# Patient Record
Sex: Female | Born: 1960 | Race: Black or African American | Hispanic: No | Marital: Single | Smoking: Never smoker
Health system: Southern US, Community
[De-identification: ages and names within clinical notes are randomized; demographics above are authoritative.]

## PROBLEM LIST (undated history)

## (undated) DIAGNOSIS — I10 Essential (primary) hypertension: Secondary | ICD-10-CM

## (undated) DIAGNOSIS — R079 Chest pain, unspecified: Secondary | ICD-10-CM

## (undated) DIAGNOSIS — G8929 Other chronic pain: Secondary | ICD-10-CM

## (undated) DIAGNOSIS — F419 Anxiety disorder, unspecified: Secondary | ICD-10-CM

## (undated) DIAGNOSIS — J45909 Unspecified asthma, uncomplicated: Secondary | ICD-10-CM

## (undated) DIAGNOSIS — M25569 Pain in unspecified knee: Secondary | ICD-10-CM

## (undated) DIAGNOSIS — M549 Dorsalgia, unspecified: Secondary | ICD-10-CM

## (undated) DIAGNOSIS — R0602 Shortness of breath: Secondary | ICD-10-CM

## (undated) DIAGNOSIS — E559 Vitamin D deficiency, unspecified: Secondary | ICD-10-CM

## (undated) DIAGNOSIS — K259 Gastric ulcer, unspecified as acute or chronic, without hemorrhage or perforation: Secondary | ICD-10-CM

## (undated) DIAGNOSIS — G479 Sleep disorder, unspecified: Secondary | ICD-10-CM

## (undated) DIAGNOSIS — F32A Depression, unspecified: Secondary | ICD-10-CM

## (undated) DIAGNOSIS — R12 Heartburn: Secondary | ICD-10-CM

## (undated) DIAGNOSIS — M7989 Other specified soft tissue disorders: Secondary | ICD-10-CM

## (undated) DIAGNOSIS — R519 Headache, unspecified: Secondary | ICD-10-CM

## (undated) HISTORY — DX: Shortness of breath: R06.02

## (undated) HISTORY — DX: Sleep disorder, unspecified: G47.9

## (undated) HISTORY — DX: Other chronic pain: G89.29

## (undated) HISTORY — DX: Dorsalgia, unspecified: M54.9

## (undated) HISTORY — DX: Pain in unspecified knee: M25.569

## (undated) HISTORY — DX: Anxiety disorder, unspecified: F41.9

## (undated) HISTORY — DX: Headache, unspecified: R51.9

## (undated) HISTORY — DX: Unspecified asthma, uncomplicated: J45.909

## (undated) HISTORY — DX: Chest pain, unspecified: R07.9

## (undated) HISTORY — DX: Depression, unspecified: F32.A

## (undated) HISTORY — DX: Other specified soft tissue disorders: M79.89

## (undated) HISTORY — DX: Vitamin D deficiency, unspecified: E55.9

## (undated) HISTORY — DX: Essential (primary) hypertension: I10

## (undated) HISTORY — DX: Heartburn: R12

## (undated) HISTORY — DX: Gastric ulcer, unspecified as acute or chronic, without hemorrhage or perforation: K25.9

---

## 1996-06-10 HISTORY — PX: OVARIAN CYST REMOVAL: SHX89

## 1998-07-28 ENCOUNTER — Encounter: Payer: Self-pay | Admitting: Obstetrics and Gynecology

## 1998-07-28 ENCOUNTER — Ambulatory Visit (HOSPITAL_COMMUNITY): Admission: RE | Admit: 1998-07-28 | Discharge: 1998-07-28 | Payer: Self-pay | Admitting: Obstetrics and Gynecology

## 1998-08-07 ENCOUNTER — Ambulatory Visit (HOSPITAL_COMMUNITY): Admission: RE | Admit: 1998-08-07 | Discharge: 1998-08-07 | Payer: Self-pay | Admitting: Obstetrics and Gynecology

## 1998-08-07 ENCOUNTER — Encounter: Payer: Self-pay | Admitting: Obstetrics and Gynecology

## 1999-01-25 ENCOUNTER — Other Ambulatory Visit: Admission: RE | Admit: 1999-01-25 | Discharge: 1999-01-25 | Payer: Self-pay | Admitting: Obstetrics and Gynecology

## 2000-06-20 ENCOUNTER — Other Ambulatory Visit: Admission: RE | Admit: 2000-06-20 | Discharge: 2000-06-20 | Payer: Self-pay | Admitting: Obstetrics and Gynecology

## 2001-08-05 ENCOUNTER — Other Ambulatory Visit: Admission: RE | Admit: 2001-08-05 | Discharge: 2001-08-05 | Payer: Self-pay | Admitting: Obstetrics and Gynecology

## 2001-10-12 ENCOUNTER — Other Ambulatory Visit: Admission: RE | Admit: 2001-10-12 | Discharge: 2001-10-12 | Payer: Self-pay | Admitting: Obstetrics and Gynecology

## 2002-11-22 ENCOUNTER — Other Ambulatory Visit: Admission: RE | Admit: 2002-11-22 | Discharge: 2002-11-22 | Payer: Self-pay | Admitting: Obstetrics and Gynecology

## 2003-12-27 ENCOUNTER — Other Ambulatory Visit: Admission: RE | Admit: 2003-12-27 | Discharge: 2003-12-27 | Payer: Self-pay | Admitting: Obstetrics and Gynecology

## 2005-03-04 ENCOUNTER — Other Ambulatory Visit: Admission: RE | Admit: 2005-03-04 | Discharge: 2005-03-04 | Payer: Self-pay | Admitting: Obstetrics and Gynecology

## 2005-03-15 ENCOUNTER — Encounter: Admission: RE | Admit: 2005-03-15 | Discharge: 2005-03-15 | Payer: Self-pay | Admitting: Obstetrics and Gynecology

## 2008-06-09 ENCOUNTER — Encounter: Admission: RE | Admit: 2008-06-09 | Discharge: 2008-06-09 | Payer: Self-pay | Admitting: Obstetrics and Gynecology

## 2009-07-28 ENCOUNTER — Encounter: Admission: RE | Admit: 2009-07-28 | Discharge: 2009-07-28 | Payer: Self-pay | Admitting: Obstetrics and Gynecology

## 2010-06-30 ENCOUNTER — Encounter: Payer: Self-pay | Admitting: Obstetrics and Gynecology

## 2013-12-27 ENCOUNTER — Ambulatory Visit: Payer: Self-pay | Admitting: Podiatry

## 2014-01-19 ENCOUNTER — Ambulatory Visit (INDEPENDENT_AMBULATORY_CARE_PROVIDER_SITE_OTHER): Payer: 59

## 2014-01-19 ENCOUNTER — Encounter: Payer: Self-pay | Admitting: Podiatry

## 2014-01-19 ENCOUNTER — Ambulatory Visit (INDEPENDENT_AMBULATORY_CARE_PROVIDER_SITE_OTHER): Payer: 59 | Admitting: Podiatry

## 2014-01-19 VITALS — BP 115/69 | HR 66 | Resp 16 | Ht 64.0 in | Wt 170.0 lb

## 2014-01-19 DIAGNOSIS — L84 Corns and callosities: Secondary | ICD-10-CM

## 2014-01-19 DIAGNOSIS — M898X9 Other specified disorders of bone, unspecified site: Secondary | ICD-10-CM

## 2014-01-19 DIAGNOSIS — M7750 Other enthesopathy of unspecified foot: Secondary | ICD-10-CM

## 2014-01-19 MED ORDER — TRIAMCINOLONE ACETONIDE 10 MG/ML IJ SUSP
10.0000 mg | Freq: Once | INTRAMUSCULAR | Status: AC
  Administered 2014-01-19: 10 mg

## 2014-01-19 NOTE — Progress Notes (Signed)
   Subjective:    Patient ID: Alison GroomsCarmen D Cook, female    DOB: 1961-03-12, 53 y.o.   MRN: [id number]  HPI Comments: "I had a larger knot on my foot but it has went down some"  Patient c/o tender dorsal foot right for about 2 months. There is a knot. She states that it was much larger few days ago. Painful with shoes and walking.   Also concerned about a callused area plantar 1st toe left.  Foot Pain Associated symptoms include diaphoresis, fatigue, headaches and numbness.      Review of Systems  Constitutional: Positive for diaphoresis, appetite change, fatigue and unexpected weight change.  HENT: Positive for sinus pressure.   Gastrointestinal: Positive for constipation and abdominal distention.  Musculoskeletal: Positive for back pain.  Allergic/Immunologic: Positive for food allergies.  Neurological: Positive for dizziness, light-headedness, numbness and headaches.  Hematological: Positive for adenopathy.  All other systems reviewed and are negative.      Objective:   Physical Exam        Assessment & Plan:

## 2014-01-20 NOTE — Progress Notes (Signed)
Subjective:     Patient ID: Alison Cook, female   DOB: 1961/05/18, 53 y.o.   MRN: [id number]  Foot Pain   patient has spot on top of her right foot that's really been bothering her and makes it hard to wear shoe gear comfortably and also has callus on her left big toe which can become sore. It's been going on for several months   Review of Systems  All other systems reviewed and are negative.      Objective:   Physical Exam  Nursing note and vitals reviewed. Constitutional: She is oriented to person, place, and time.  Cardiovascular: Intact distal pulses.   Musculoskeletal: Normal range of motion.  Neurological: She is oriented to person, place, and time.  Skin: Skin is warm.   neurovascular status intact with muscle strength adequate and range of motion of the subtalar midtarsal joint within normal limits. Patient is found to have a area of inflammation on the dorsum of the right foot that is painful when pressed from a dorsal direction with fluid buildup and is noted to have keratotic lesion left big toe. 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 Careful injection administered dorsal to reduce inflammation and if it does not get better we will need to consider tarsal exostectomy and debrided tissue on the left hallux

## 2015-06-26 ENCOUNTER — Encounter: Payer: Self-pay | Admitting: Podiatry

## 2015-06-26 ENCOUNTER — Ambulatory Visit (INDEPENDENT_AMBULATORY_CARE_PROVIDER_SITE_OTHER): Payer: 59 | Admitting: Podiatry

## 2015-06-26 ENCOUNTER — Ambulatory Visit (INDEPENDENT_AMBULATORY_CARE_PROVIDER_SITE_OTHER): Payer: 59

## 2015-06-26 DIAGNOSIS — M722 Plantar fascial fibromatosis: Secondary | ICD-10-CM

## 2015-06-26 MED ORDER — TRIAMCINOLONE ACETONIDE 10 MG/ML IJ SUSP
10.0000 mg | Freq: Once | INTRAMUSCULAR | Status: AC
  Administered 2015-06-26: 10 mg

## 2015-06-26 MED ORDER — DICLOFENAC SODIUM 75 MG PO TBEC: 75.0000 mg | DELAYED_RELEASE_TABLET | Freq: Two times a day (BID) | ORAL | Status: DC

## 2015-06-26 NOTE — Patient Instructions (Signed)

## 2015-06-28 NOTE — Progress Notes (Signed)
Subjective:     Patient ID: Alison Cook, female   DOB: 13-May-1961, 55 y.o.   MRN: [id number]  HPI patient states I have a lot of pain in the bottom of both my heels left over right and it's been present for around a year. I've tried supportive shoes I tried stretching exercises without relief   Review of Systems  All other systems reviewed and are negative.      Objective:   Physical Exam  Constitutional: She is oriented to person, place, and time.  Cardiovascular: Intact distal pulses.   Musculoskeletal: Normal range of motion.  Neurological: She is oriented to person, place, and time.  Skin: Skin is warm.  Nursing note and vitals reviewed.  neurovascular status found to be intact with muscle strength adequate range of motion within normal limits. Patient's noted to have exquisite discomfort in the plantar aspect of the heel left over right with inflammation fluid around the medial band and also noted that there is moderate depression of the arch bilateral. 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 Today I went ahead and I injected the plantar fascia bilateral 3 mg Kenalog 5 mg Xylocaine and applied fascial brace and instructed on reduced activity physical therapy and supportive shoe gear usage. Reappoint to recheck

## 2015-07-10 ENCOUNTER — Ambulatory Visit: Payer: 59 | Admitting: Podiatry

## 2015-07-21 ENCOUNTER — Encounter: Payer: Self-pay | Admitting: Podiatry

## 2015-07-21 ENCOUNTER — Ambulatory Visit (INDEPENDENT_AMBULATORY_CARE_PROVIDER_SITE_OTHER): Payer: 59 | Admitting: Podiatry

## 2015-07-21 VITALS — BP 120/68 | HR 68 | Resp 16

## 2015-07-21 DIAGNOSIS — M722 Plantar fascial fibromatosis: Secondary | ICD-10-CM

## 2015-07-24 NOTE — Progress Notes (Signed)
Subjective:     Patient ID: Alison Cook, female   DOB: 1960/12/17, 55 y.o.   MRN: [id number]  HPI patient states I'm improving but I'm still having pain and I've had a long-term history of the problem   Review of Systems     Objective:   Physical Exam Neurovascular status intact muscle strength adequate with diminished arch height noted and pain still noted upon palpation to the medial band of the plantar fascia    Assessment:     Continued fasciitis-like symptoms with depressed arch is part of the pathological process    Plan:     Reviewed physical therapy and anti-inflammatory supportive shoes and scanned for custom orthotics to reduce all plantar stresses on the feet. Patient will be seen back when they're ready or earlier if needed

## 2015-08-11 ENCOUNTER — Encounter: Payer: Self-pay | Admitting: Podiatry

## 2015-08-11 ENCOUNTER — Ambulatory Visit (INDEPENDENT_AMBULATORY_CARE_PROVIDER_SITE_OTHER): Payer: 59 | Admitting: Podiatry

## 2015-08-11 DIAGNOSIS — M722 Plantar fascial fibromatosis: Secondary | ICD-10-CM

## 2015-08-11 NOTE — Patient Instructions (Addendum)

## 2015-08-11 NOTE — Progress Notes (Signed)
Subjective:     Patient ID: Alison GroomsCarmen D Hockley, female   DOB: 1960-12-11, 55 y.o.   MRN: [id number]  HPI this patient presents the office to check pick up her orthotics. due to her plantar fasciitis.  She states that she is not having much pain and discomfort, but is pleased to receive her orthoses   Review of Systems     Objective:   Physical Exam neurovascular status intact     Assessment:     Plantar fascitis B/L       Plan:     Dispense orthoses.  RTC prn   Helane GuntherGregory Chue Berkovich DPM

## 2017-09-05 DIAGNOSIS — Z01419 Encounter for gynecological examination (general) (routine) without abnormal findings: Secondary | ICD-10-CM | POA: Diagnosis not present

## 2017-11-17 DIAGNOSIS — R05 Cough: Secondary | ICD-10-CM | POA: Diagnosis not present

## 2017-12-12 DIAGNOSIS — R05 Cough: Secondary | ICD-10-CM | POA: Diagnosis not present

## 2017-12-12 DIAGNOSIS — J309 Allergic rhinitis, unspecified: Secondary | ICD-10-CM | POA: Diagnosis not present

## 2018-04-30 ENCOUNTER — Ambulatory Visit (INDEPENDENT_AMBULATORY_CARE_PROVIDER_SITE_OTHER): Payer: 59 | Admitting: Otolaryngology

## 2018-04-30 DIAGNOSIS — R040 Epistaxis: Secondary | ICD-10-CM

## 2018-05-28 ENCOUNTER — Ambulatory Visit (INDEPENDENT_AMBULATORY_CARE_PROVIDER_SITE_OTHER): Payer: 59 | Admitting: Otolaryngology

## 2018-05-28 DIAGNOSIS — R040 Epistaxis: Secondary | ICD-10-CM

## 2018-06-29 DIAGNOSIS — H524 Presbyopia: Secondary | ICD-10-CM | POA: Diagnosis not present

## 2018-07-21 DIAGNOSIS — J209 Acute bronchitis, unspecified: Secondary | ICD-10-CM | POA: Diagnosis not present

## 2018-09-21 ENCOUNTER — Ambulatory Visit: Payer: 59 | Admitting: Allergy and Immunology

## 2019-02-06 ENCOUNTER — Emergency Department (HOSPITAL_BASED_OUTPATIENT_CLINIC_OR_DEPARTMENT_OTHER): Payer: 59

## 2019-02-06 ENCOUNTER — Other Ambulatory Visit: Payer: Self-pay

## 2019-02-06 ENCOUNTER — Emergency Department (HOSPITAL_COMMUNITY)
Admission: EM | Admit: 2019-02-06 | Discharge: 2019-02-06 | Disposition: A | Discharge status: Home / Self Care | Payer: 59 | Attending: Emergency Medicine | Admitting: Emergency Medicine

## 2019-02-06 ENCOUNTER — Encounter (HOSPITAL_COMMUNITY): Payer: Self-pay

## 2019-02-06 DIAGNOSIS — M79662 Pain in left lower leg: Secondary | ICD-10-CM | POA: Diagnosis not present

## 2019-02-06 DIAGNOSIS — Z79899 Other long term (current) drug therapy: Secondary | ICD-10-CM | POA: Insufficient documentation

## 2019-02-06 DIAGNOSIS — M7989 Other specified soft tissue disorders: Secondary | ICD-10-CM | POA: Diagnosis not present

## 2019-02-06 DIAGNOSIS — R202 Paresthesia of skin: Secondary | ICD-10-CM | POA: Diagnosis not present

## 2019-02-06 DIAGNOSIS — R600 Localized edema: Secondary | ICD-10-CM | POA: Insufficient documentation

## 2019-02-06 DIAGNOSIS — R200 Anesthesia of skin: Secondary | ICD-10-CM | POA: Insufficient documentation

## 2019-02-06 LAB — CBC WITH DIFFERENTIAL/PLATELET
Abs Immature Granulocytes: 0.02 10*3/uL (ref 0.00–0.07)
Basophils Absolute: 0.1 10*3/uL (ref 0.0–0.1)
Basophils Relative: 1 %
Eosinophils Absolute: 0.3 10*3/uL (ref 0.0–0.5)
Eosinophils Relative: 4 %
HCT: 37.7 % (ref 36.0–46.0)
Hemoglobin: 11.8 g/dL — ABNORMAL LOW (ref 12.0–15.0)
Immature Granulocytes: 0 %
Lymphocytes Relative: 33 %
Lymphs Abs: 2.6 10*3/uL (ref 0.7–4.0)
MCH: 27.8 pg (ref 26.0–34.0)
MCHC: 31.3 g/dL (ref 30.0–36.0)
MCV: 88.9 fL (ref 80.0–100.0)
Monocytes Absolute: 0.6 10*3/uL (ref 0.1–1.0)
Monocytes Relative: 7 %
Neutro Abs: 4.1 10*3/uL (ref 1.7–7.7)
Neutrophils Relative %: 55 %
Platelets: 236 10*3/uL (ref 150–400)
RBC: 4.24 MIL/uL (ref 3.87–5.11)
RDW: 14.9 % (ref 11.5–15.5)
WBC: 7.6 10*3/uL (ref 4.0–10.5)
nRBC: 0 % (ref 0.0–0.2)

## 2019-02-06 LAB — BASIC METABOLIC PANEL
Anion gap: 6 (ref 5–15)
BUN: 10 mg/dL (ref 6–20)
CO2: 28 mmol/L (ref 22–32)
Calcium: 9.1 mg/dL (ref 8.9–10.3)
Chloride: 106 mmol/L (ref 98–111)
Creatinine, Ser: 0.75 mg/dL (ref 0.44–1.00)
GFR calc Af Amer: >60 mL/min (ref >60)
GFR calc non Af Amer: >60 mL/min (ref >60)
Glucose, Bld: 98 mg/dL (ref 70–99)
Potassium: 4 mmol/L (ref 3.5–5.1)
Sodium: 140 mmol/L (ref 135–145)

## 2019-02-06 NOTE — ED Provider Notes (Signed)
[facility] Provider Note   CSN: [id number] Arrival date & time: 02/06/19  1130     History   Chief Complaint Chief Complaint  Patient presents with  . Foot Pain    HPI Alison Cook is a 58 y.o. female presents for evaluation of left lower extremity pain, swelling that is been ongoing for the last 3 weeks.  She states that initially, it started in her foot and now has progressed up into her ankle and her lower leg.  She denies any preceding trauma, injury.  She has been trying at home treatments to see if that would help with symptoms but states that it continued, prompting her PCP visit this morning.  [facility] who advised her to come to the emergency department for further evaluation.  She states that she has noticed some overlying warmth but has not noticed much color change.  She states that it has been more painful and to the side of her tib-fib but states that she has massaged it and help with that.  She states she occasionally has some numbness and tingling to the foot.  She denies any fevers, chest pain, difficulty breathing. She denies any OCP use, recent immobilization, prior history of DVT/PE, recent surgery, leg swelling, or long travel.     The history is provided by the patient.    History reviewed. No pertinent past medical history.  There are no active problems to display for this patient.   History reviewed. No pertinent surgical history.   OB History   No obstetric history on file.      Home Medications    Prior to Admission medications   Medication Sig Start Date End Date Taking? Authorizing Provider  BEE POLLEN PO Take by mouth.    [provider]  cyclobenzaprine (FLEXERIL) 10 MG tablet Take 10 mg by mouth 3 (three) times daily as needed for muscle spasms.    [provider]  Dexlansoprazole (DEXILANT PO) Take by mouth.    [provider]  diclofenac (VOLTAREN) 75  MG EC tablet Take 1 tablet (75 mg total) by mouth 2 (two) times daily. 06/26/15   Lenn Sinkegal, Norman S, DPM  Digestive Enzymes (ENZYME DIGEST PO) Take by mouth.    [provider]  doxycycline (VIBRAMYCIN) 100 MG capsule Take 100 mg by mouth 2 (two) times daily.    [provider]  Flaxseed, Linseed, (FLAX SEED OIL PO) Take by mouth.    [provider]  Fluticasone Propionate (FLONASE NA) Place into the nose.    [provider]  Linaclotide (LINZESS PO) Take by mouth.    [provider]  Lorcaserin HCl (BELVIQ PO) Take by mouth.    [provider]  Multiple Vitamins-Minerals (MULTIVITAMIN PO) Take by mouth.    [provider]  Probiotic Product (PROBIOTIC DAILY PO) Take by mouth.    [provider]    Family History History reviewed. No pertinent family history.  Social History Social History   Tobacco Use  . Smoking status: Never Smoker  Substance Use Topics  . Alcohol use: Not on file  . Drug use: Not on file     Allergies   Banana and Tomato   Review of Systems Review of Systems  Constitutional: Negative for fever.  Cardiovascular: Positive for leg swelling.  Neurological: Positive for numbness. Negative for weakness.  All other systems reviewed and are negative.    Physical Exam Updated Vital  Signs BP 136/81 (BP Location: Right Arm)   Pulse 65   Temp 98 F (36.7 C) (Oral)   Resp 16   SpO2 96%   Physical Exam Vitals signs and nursing note reviewed.  Constitutional:      Appearance: Normal appearance. She is well-developed.  HENT:     Head: Normocephalic and atraumatic.  Eyes:     General: Lids are normal.     Conjunctiva/sclera: Conjunctivae normal.     Pupils: Pupils are equal, round, and reactive to light.  Neck:     Musculoskeletal: Full passive range of motion without pain.  Cardiovascular:     Rate and Rhythm: Normal rate and regular rhythm.     Pulses:          Dorsalis pedis  pulses are 2+ on the right side and detected w/ Doppler on the left side.     Heart sounds: Normal heart sounds. No murmur. No friction rub. No gallop.      Comments: Difficulty obtaining palpable left DP pulse.  Easily obtainable with Doppler. Pulmonary:     Effort: Pulmonary effort is normal.     Breath sounds: Normal breath sounds.     Comments: Lungs clear to auscultation bilaterally.  Symmetric chest rise.  No wheezing, rales, rhonchi. Abdominal:     Palpations: Abdomen is soft. Abdomen is not rigid.     Tenderness: There is no abdominal tenderness. There is no guarding.     Comments: Abdomen is soft, non-distended, non-tender. No rigidity, No guarding. No peritoneal signs.  Musculoskeletal: Normal range of motion.     Comments: Left lower extremity with 1+ pitting edema that extends to the proximal tib-fib and is obviously larger than right lower extremity.  There is some tenderness noted to the medial aspect of the left tib-fib.  Some mild overlying warmth.  No obvious erythema.  Skin:    General: Skin is warm and dry.     Capillary Refill: Capillary refill takes less than 2 seconds.     Comments: Good distal cap refill. LLE is not dusky in appearance or cool to touch.  Neurological:     Mental Status: She is alert and oriented to person, place, and time.     Comments: Patient reports a tingling sensation to toes but states she can feel me touching her.  Psychiatric:        Speech: Speech normal.      ED Treatments / Results  Labs (all labs ordered are listed, but only abnormal results are displayed) Labs Reviewed  CBC WITH DIFFERENTIAL/PLATELET - Abnormal; Notable for the following components:      Result Value   Hemoglobin 11.8 (*)    All other components within normal limits  BASIC METABOLIC PANEL    EKG None  Radiology Vas [place] Lower Extremity Venous (dvt) ([facility] 7a-7p)  Result Date: 02/06/2019  Lower Venous Study Indications: Foot and ankle swelling.   Comparison Study: No prior study on file for comparison Performing Technologist: Sherren Kerns RVS  Examination Guidelines: A complete evaluation includes B-mode imaging, spectral Doppler, color Doppler, and power Doppler as needed of all accessible portions of each vessel. Bilateral testing is considered an integral part of a complete examination. Limited examinations for reoccurring indications may be performed as noted.  +-----+---------------+---------+-----------+----------+--------------+ RIGHTCompressibilityPhasicitySpontaneityPropertiesThrombus Aging +-----+---------------+---------+-----------+----------+--------------+ CFV                 Yes      Yes                                 +-----+---------------+---------+-----------+----------+--------------+   +---------+---------------+---------+-----------+----------+--------------+  LEFT     CompressibilityPhasicitySpontaneityPropertiesThrombus Aging +---------+---------------+---------+-----------+----------+--------------+ CFV      Full           Yes      Yes                                 +---------+---------------+---------+-----------+----------+--------------+ SFJ      Full                                                        +---------+---------------+---------+-----------+----------+--------------+ FV Prox  Full                                                        +---------+---------------+---------+-----------+----------+--------------+ FV Mid   Full                                                        +---------+---------------+---------+-----------+----------+--------------+ FV DistalFull                                                        +---------+---------------+---------+-----------+----------+--------------+ PFV      Full                                                        +---------+---------------+---------+-----------+----------+--------------+ POP      Full            Yes      Yes                                 +---------+---------------+---------+-----------+----------+--------------+ PTV      Full                                                        +---------+---------------+---------+-----------+----------+--------------+ PERO     Full                                                        +---------+---------------+---------+-----------+----------+--------------+     Summary: Right: No evidence of common femoral vein obstruction. Left: There is no evidence of deep vein thrombosis in the lower extremity.  *See table(s) above for measurements and observations. Electronically signed by Deitra Mayo MD on 02/06/2019 at 6:06:02 PM.  Final     Procedures Procedures (including critical care time)  Medications Ordered in ED Medications - No data to display   Initial Impression / Assessment and Plan / ED Course  I have reviewed the triage vital signs and the nursing notes.  Pertinent labs & imaging results that were available during my care of the patient were reviewed by me and considered in my medical decision making (see chart for details).        58 year old female who presents for evaluation of left lower extremity swelling x3 weeks.  No trauma, injury, fall.  Seen by PCP and sent over here for further evaluation.  No chest pain, shortness of breath. Patient is afebrile, non-toxic appearing, sitting comfortably on examination table. Vital signs reviewed and stable.  Concern for DVT versus infectious process vs vascular insufficiency.  Plan for labs, ultrasound venous duplex. History/physical exam not concerning for ischemic limb.  CBC shows no leukocytosis. Hgb stable at 11.8. BMP is unremarkable.   US shows no evidence of DVT.  Her symptoms could be related to vascular insufficiency.  I discussed results with patient.  At this time, I suspect that her symptoms are likely related to venous insufficiency.  She has no  white blood cell count, no fever.  Do not believe that this is infectious process.  Instructed patient on compression socks and elevation will give her vascular follow-up.  I did discuss at length with patient regarding further work-up here in the ED.  Engaged in shared decision making regarding further imaging to look for any other value of any compressive abnormality that would be in the abdomen that would be causing her symptoms.  Her edema stops at the tib-fib and does not extend all the way up to the leg.  After extensive discussion, patient wishes to try at home supportive care measures and follow-up with vascular.  Declines imaging at this time.  Given her reassuring work-up, I feel that this is reasonable. At this time, patient exhibits no emergent life-threatening condition that require further evaluation in ED or admission. Patient had ample opportunity for questions and discussion. All patient's questions were answered with full understanding. Strict return precautions discussed. Patient expresses understanding and agreement to plan.   Portions of this note were generated with Scientist, clinical (histocompatibility and immunogenetics)Dragon dictation software. Dictation errors may occur despite best attempts at proofreading.   Final Clinical Impressions(s) / ED Diagnoses   Final diagnoses:  Leg edema, left    ED Discharge Orders    None       Rosana HoesLayden, Lynita Groseclose A, PA-C 02/06/19 1850    Tegeler, Canary Brimhristopher J, MD 02/06/19 [phone]

## 2019-02-06 NOTE — ED Triage Notes (Signed)
Pt arrives for swelling in L foot, ongoing for approximately 3 weeks which she has been treating homeopathically. Approx two days ago worsening today toes began going numb, able to move foot and toes, pulse palpable but not as strong as R side. No known injury, has not travelled recently or been sedintary for long periods of time.

## 2019-02-06 NOTE — Progress Notes (Signed)
VASCULAR LAB PRELIMINARY  PRELIMINARY  PRELIMINARY  PRELIMINARY  Left lower extremity venous duplex completed.    Preliminary report:  See CV proc for preliminary results.  Gave results to Providence Lanius, PA-C  Gearold Wainer, RVT 02/06/2019, 4:14 PM

## 2019-02-06 NOTE — Discharge Instructions (Signed)
As we discussed today, your ultrasound did not show any evidence of blood clot in your leg.  As we discussed, this may be due to venous insufficiency.  Please wear compression socks and elevate your leg.  Follow-up with the vascular doctors.  Follow-up with your primary care doctors in the next 2 to 4 days.  Call their office and arrange for a follow-up appointment.  Return the emergency department for any worsening pain, discoloration of the leg, numbness/weakness, fevers or any other worsening or concerning symptoms.

## 2019-02-06 NOTE — ED Triage Notes (Signed)
Went to [facility] first, sent here to rule out DVT

## 2019-02-19 ENCOUNTER — Other Ambulatory Visit: Payer: Self-pay | Admitting: Family Medicine

## 2019-02-19 DIAGNOSIS — R600 Localized edema: Secondary | ICD-10-CM

## 2019-02-25 ENCOUNTER — Ambulatory Visit
Admission: RE | Admit: 2019-02-25 | Discharge: 2019-02-25 | Disposition: A | Discharge status: Home / Self Care | Payer: 59 | Source: Ambulatory Visit | Attending: Family Medicine | Admitting: Family Medicine

## 2019-02-25 DIAGNOSIS — R600 Localized edema: Secondary | ICD-10-CM

## 2019-02-25 MED ORDER — IOPAMIDOL (ISOVUE-370) INJECTION 76%
125.0000 mL | Freq: Once | INTRAVENOUS | Status: AC | PRN
  Administered 2019-02-25: 125 mL via INTRAVENOUS

## 2019-06-26 ENCOUNTER — Ambulatory Visit (HOSPITAL_COMMUNITY)
Admission: EM | Admit: 2019-06-26 | Discharge: 2019-06-26 | Disposition: A | Discharge status: Home / Self Care | Payer: 59 | Attending: Family Medicine | Admitting: Family Medicine

## 2019-06-26 ENCOUNTER — Other Ambulatory Visit: Payer: Self-pay

## 2019-06-26 ENCOUNTER — Encounter (HOSPITAL_COMMUNITY): Payer: Self-pay

## 2019-06-26 DIAGNOSIS — Z20822 Contact with and (suspected) exposure to covid-19: Secondary | ICD-10-CM | POA: Diagnosis present

## 2019-06-26 NOTE — ED Provider Notes (Signed)
[place]   MRN: [id number] DOB: [birth year]  Subjective:   Alison Cook is a 60 y.o. female presenting for Covid exposure.  Patient lives with her daughter, she tested positive for COVID-19.  Patient has not had any kinds of symptoms.  No current facility-administered medications for this encounter.  Current Outpatient Medications:  .  BEE POLLEN PO, Take by mouth., Disp: , Rfl:  .  cyclobenzaprine (FLEXERIL) 10 MG tablet, Take 10 mg by mouth 3 (three) times daily as needed for muscle spasms., Disp: , Rfl:  .  Dexlansoprazole (DEXILANT PO), Take by mouth., Disp: , Rfl:  .  diclofenac (VOLTAREN) 75 MG EC tablet, Take 1 tablet (75 mg total) by mouth 2 (two) times daily., Disp: 50 tablet, Rfl: 2 .  Digestive Enzymes (ENZYME DIGEST PO), Take by mouth., Disp: , Rfl:  .  doxycycline (VIBRAMYCIN) 100 MG capsule, Take 100 mg by mouth 2 (two) times daily., Disp: , Rfl:  .  Flaxseed, Linseed, (FLAX SEED OIL PO), Take by mouth., Disp: , Rfl:  .  Fluticasone Propionate (FLONASE NA), Place into the nose., Disp: , Rfl:  .  Linaclotide (LINZESS PO), Take by mouth., Disp: , Rfl:  .  Lorcaserin HCl (BELVIQ PO), Take by mouth., Disp: , Rfl:  .  Multiple Vitamins-Minerals (MULTIVITAMIN PO), Take by mouth., Disp: , Rfl:  .  Probiotic Product (PROBIOTIC DAILY PO), Take by mouth., Disp: , Rfl:    Allergies  Allergen Reactions  . Banana   . Tomato     History reviewed. No pertinent past medical history.   History reviewed. No pertinent surgical history.  History reviewed. No pertinent family history.  Social History   Tobacco Use  . Smoking status: Never Smoker  . Smokeless tobacco: Never Used  Substance Use Topics  . Alcohol use: Not on file  . Drug use: Not on file    Review of Systems  Constitutional: Negative for fever and malaise/fatigue.  HENT: Negative for congestion, ear pain, sinus pain and sore throat.   Eyes: Negative for discharge and redness.  Respiratory:  Negative for cough, hemoptysis, shortness of breath and wheezing.   Cardiovascular: Negative for chest pain.  Gastrointestinal: Negative for abdominal pain, diarrhea, nausea and vomiting.  Genitourinary: Negative for dysuria, flank pain and hematuria.  Musculoskeletal: Negative for myalgias.  Skin: Negative for rash.  Neurological: Negative for dizziness, weakness and headaches.  Psychiatric/Behavioral: Negative for depression and substance abuse.     Objective:   Vitals: BP 135/80 (BP Location: Right Arm)   Pulse 87   Temp 99.1 F (37.3 C) (Oral)   Resp 18   Wt 178 lb (80.7 kg)   SpO2 100%   BMI 30.55 kg/m   Physical Exam Constitutional:      General: She is not in acute distress.    Appearance: Normal appearance. She is well-developed. She is not ill-appearing, toxic-appearing or diaphoretic.  HENT:     Head: Normocephalic and atraumatic.     Nose: Nose normal.     Mouth/Throat:     Mouth: Mucous membranes are moist.     Pharynx: Oropharynx is clear.  Eyes:     General: No scleral icterus.    Extraocular Movements: Extraocular movements intact.     Pupils: Pupils are equal, round, and reactive to light.  Cardiovascular:     Rate and Rhythm: Normal rate.  Pulmonary:     Effort: Pulmonary effort is normal.  Skin:    General: Skin is warm  and dry.  Neurological:     General: No focal deficit present.     Mental Status: She is alert and oriented to person, place, and time.  Psychiatric:        Mood and Affect: Mood normal.        Behavior: Behavior normal.        Thought Content: Thought content normal.        Judgment: Judgment normal.      Assessment and Plan :   1. Close exposure to COVID-19 virus     Counseled patient on nature of COVID-19 including modes of transmission, diagnostic testing, management and supportive care.  Counseled on medications used for symptomatic relief. COVID 19 testing is pending. Counseled patient on potential for adverse  effects with medications prescribed/recommended today, ER and return-to-clinic precautions discussed, patient verbalized understanding.    Wallis Bamberg, PA-C 06/26/19 1100

## 2019-06-26 NOTE — ED Triage Notes (Signed)
Pt states she needs a covid test. Pt states her daughter tested positive 10 days ago.

## 2019-06-26 NOTE — Discharge Instructions (Signed)
For sore throat or cough try using a honey-based tea. Use 3 teaspoons of honey with juice squeezed from half lemon. Place shaved pieces of ginger into 1/2-1 cup of water and warm over stove top. Then mix the ingredients and repeat every 4 hours as needed. Please take Tylenol 500mg every 6 hours. Hydrate very well with at least 2 liters of water. Eat light meals such as soups to replenish electrolytes and soft fruits, veggies. Start an antihistamine like Zyrtec, Allegra or Claritin for postnasal drainage, sinus congestion.  You can take this together with pseudoephedrine (Sudafed) at a dose of 60 mg 3 times a day or 120 mg twice daily as needed for the same kind of congestion.    

## 2019-06-27 LAB — NOVEL CORONAVIRUS, NAA (HOSP ORDER, SEND-OUT TO REF LAB; TAT 18-24 HRS): SARS-CoV-2, NAA: NOT DETECTED

## 2019-09-27 ENCOUNTER — Other Ambulatory Visit: Payer: Self-pay

## 2019-09-27 ENCOUNTER — Ambulatory Visit (INDEPENDENT_AMBULATORY_CARE_PROVIDER_SITE_OTHER): Payer: 59 | Admitting: Podiatrist

## 2019-09-27 ENCOUNTER — Encounter: Payer: Self-pay | Admitting: Podiatrist

## 2019-09-27 VITALS — Temp 97.8°F

## 2019-09-27 DIAGNOSIS — L603 Nail dystrophy: Secondary | ICD-10-CM

## 2019-09-27 DIAGNOSIS — R609 Edema, unspecified: Secondary | ICD-10-CM | POA: Diagnosis not present

## 2019-09-27 DIAGNOSIS — M79676 Pain in unspecified toe(s): Secondary | ICD-10-CM

## 2019-09-27 NOTE — Progress Notes (Signed)
  Chief Complaint  Patient presents with  . Nail Problem    Thick, long, painful, discolored toenails - L foot, all nails. x1+ yr.      HPI: Patient is 59 y.o. female who presents today for the concerns as listed above.  She also has some new swelling of her legs of which she is concerned.  She had venous studies done and they were negative for dvt or svt.   Review of Systems  DATA OBTAINED: from patient  GENERAL: Feels well no fevers, no fatigue, no changes in appetite SKIN: No itching, no rashes, no open wounds EYES: No eye pain,no redness, no discharge EARS: No earache,no ringing of ears, NOSE: No congestion, no drainage, no bleeding  MOUTH/THROAT: No mouth pain, No sore throat, No difficulty chewing or swallowing  RESPIRATORY: No cough, no wheezing, no SOB CARDIAC: No chest pain,no heart palpitations, GI: No abdominal pain, No Nausea, no vomiting, no diarrhea, no heartburn or no reflux  GU: No dysuria, no increased frequency or urgency MUSCULOSKELETAL: No unrelieved bone/joint pain,  NEUROLOGIC: Awake, alert, appropriate to situation, No change in mental status. PSYCHIATRIC: No overt anxiety or sadness.No behavior issue.      Physical Exam  GENERAL APPEARANCE: Alert, conversant. Appropriately groomed. No acute distress.   VASCULAR: Pedal pulses palpable DP and PT bilateral.  Capillary refill time is immediate to all digits,  Proximal to distal cooling it warm to warm.  Digital hair growth is decreased bilateral. Swelling and edema present bilateral feet and legs- left greater than right  NEUROLOGIC: sensation is intact epicritically and protectively to 5.07 monofilament at 5/5 sites bilateral.  Light touch is intact bilateral, vibratory sensation intact bilateral, achilles tendon reflex is intact bilateral.   MUSCULOSKELETAL: acceptable muscle strength, tone and stability bilateral.  Intrinsic muscluature intact bilateral.  Range of motion at ankle and first MPJ is normal  bilateral.   DERMATOLOGIC: skin is warm, supple, and dry.  No open lesions noted.  No interdigital maceration noted bilateral.  Digital nails are thick, discolored with subungual debris present bilateral feet.   Assessment     ICD-10-CM   1. Edema, unspecified type  R60.9   2. Dystrophic nail  L60.3      Plan  Recommended a follow up with her primary care for further evaluation of edema as it does not appear to be caused from a foot or ankle problem and she already had her venous studies done which ruled out a venous cause. She will continue to wear her compression hose.  I also debrided her nails today without complication. She will return as needed for follow up or in 3 months for continued nail care.

## 2019-09-27 NOTE — Patient Instructions (Signed)
I have ordered a medication for you that will come from [facility] in [place]. They should be calling you to verify insurance and will mail the medication to you. If you live close by then you can go by their pharmacy to pick up the medication. Their phone number is [phone]. If you do not hear from them in the next few days, please give [place] a call at [phone].    Edema  Edema is when you have too much fluid in your body or under your skin. Edema may make your legs, feet, and ankles swell up. Swelling is also common in looser tissues, like around your eyes. This is a common condition. It gets more common as you get older. There are many possible causes of edema. Eating too much salt (sodium) and being on your feet or sitting for a long time can cause edema in your legs, feet, and ankles. Hot weather may make edema worse. Edema is usually painless. Your skin may look swollen or shiny. Follow these instructions at home:  Keep the swollen body part raised (elevated) above the level of your heart when you are sitting or lying down.  Do not sit still or stand for a long time.  Do not wear tight clothes. Do not wear garters on your upper legs.  Exercise your legs. This can help the swelling go down.  Wear elastic bandages or support stockings as told by your doctor.  Eat a low-salt (low-sodium) diet to reduce fluid as told by your doctor.  Depending on the cause of your swelling, you may need to limit how much fluid you drink (fluid restriction).  Take over-the-counter and prescription medicines only as told by your doctor. Contact a doctor if:  Treatment is not working.  You have heart, liver, or kidney disease and have symptoms of edema.  You have sudden and unexplained weight gain. Get help right away if:  You have shortness of breath or chest pain.  You cannot breathe when you lie down.  You have pain, redness, or warmth in the swollen areas.  You have heart,  liver, or kidney disease and get edema all of a sudden.  You have a fever and your symptoms get worse all of a sudden. Summary  Edema is when you have too much fluid in your body or under your skin.  Edema may make your legs, feet, and ankles swell up. Swelling is also common in looser tissues, like around your eyes.  Raise (elevate) the swollen body part above the level of your heart when you are sitting or lying down.  Follow your doctor's instructions about diet and how much fluid you can drink (fluid restriction). This information is not intended to replace advice given to you by your health care provider. Make sure you discuss any questions you have with your health care provider. Document Revised: 05/30/2017 Document Reviewed: 06/14/2016 Elsevier Patient Education  2020 [employer].

## 2020-02-23 ENCOUNTER — Other Ambulatory Visit: Payer: Self-pay | Admitting: Obstetrics and Gynecology

## 2020-02-23 DIAGNOSIS — N6310 Unspecified lump in the right breast, unspecified quadrant: Secondary | ICD-10-CM

## 2020-03-01 ENCOUNTER — Other Ambulatory Visit: Payer: 59

## 2020-03-01 ENCOUNTER — Other Ambulatory Visit: Payer: Self-pay

## 2020-03-01 DIAGNOSIS — M7989 Other specified soft tissue disorders: Secondary | ICD-10-CM

## 2020-03-02 ENCOUNTER — Encounter: Payer: 59 | Admitting: Vascular Surgery

## 2020-03-02 ENCOUNTER — Encounter (HOSPITAL_COMMUNITY): Payer: 59

## 2020-03-08 ENCOUNTER — Ambulatory Visit
Admission: RE | Admit: 2020-03-08 | Discharge: 2020-03-08 | Disposition: A | Discharge status: Home / Self Care | Payer: 59 | Source: Ambulatory Visit | Attending: Obstetrics and Gynecology | Admitting: Obstetrics and Gynecology

## 2020-03-08 ENCOUNTER — Other Ambulatory Visit: Payer: Self-pay

## 2020-03-08 DIAGNOSIS — N6310 Unspecified lump in the right breast, unspecified quadrant: Secondary | ICD-10-CM

## 2020-03-16 ENCOUNTER — Encounter: Payer: Self-pay | Admitting: Vascular Surgery

## 2020-03-16 ENCOUNTER — Ambulatory Visit (HOSPITAL_COMMUNITY)
Admission: RE | Admit: 2020-03-16 | Discharge: 2020-03-16 | Disposition: A | Discharge status: Home / Self Care | Payer: 59 | Source: Ambulatory Visit | Attending: Vascular Surgery | Admitting: Vascular Surgery

## 2020-03-16 ENCOUNTER — Other Ambulatory Visit: Payer: Self-pay

## 2020-03-16 ENCOUNTER — Ambulatory Visit (INDEPENDENT_AMBULATORY_CARE_PROVIDER_SITE_OTHER): Payer: 59 | Admitting: Vascular Surgery

## 2020-03-16 VITALS — BP 147/88 | HR 63 | Temp 98.5°F | Resp 18 | Ht 63.0 in | Wt 192.4 lb

## 2020-03-16 DIAGNOSIS — M7989 Other specified soft tissue disorders: Secondary | ICD-10-CM

## 2020-03-16 NOTE — Progress Notes (Signed)
REASON FOR CONSULT:    Left lower extremity edema.  The consult is requested by Dr. Tally Joe.  ASSESSMENT & PLAN:   LEFT LOWER EXTREMITY SWELLING: This patient has some mild chronic left lower extremity swelling.  She may have some mild lymphedema although her exam is fairly unimpressive.  I do not think she has significant venous disease.  She has no evidence of DVT on 3 separate studies.  On her study today she has no evidence of significant venous reflux or chronic venous insufficiency.  I have explained that whenever her legs are lower than her heart we do get swelling and we have discussed the importance of intermittent leg elevation and the proper positioning for this.  I explained that I do not think sitting in a chair and elevating her leg in a stool helps and that may actually make things worse.  I have encouraged her at work when she is sitting to get up and move around to activate her calf muscle pump.  We have discussed importance of exercise specifically walking and water aerobics.  I explained that it might be worth trying to get fitted for some knee-high compression stockings with only a mild gradient and maybe this would help her symptoms.  I am afraid that the stockings she got on the Internet may not fit correctly.  I reassured her that she has no evidence of arterial insufficiency.  I will be happy to see her back in any time if she develops any new vascular issues.  Waverly Ferrari, MD Office: [phone]   HPI:   Alison Cook is a pleasant 59 y.o. female, who is referred with left lower extremity swelling.  I have reviewed the records from the referring office.  The patient was seen with left lower extremity edema.  The patient's work-up had included a CT of the abdomen and pelvis with venous phase and this showed that the IVC and iliac veins were normal caliber and patency without evidence of stenosis, thrombus, or mass-effect.  I believe this was ordered by an  outpatient vein center.  On my history the patient developed gradual onset of left lower extremity swelling about a year ago.  Of note she had dropped something on her foot prior to that but has no previous history of DVT.  She had no previous vein procedures.  She has a job that requires her to sit all day and she is been doing this for 20+ years.  She has tried compression stockings that she got on the Internet but she states that these make her symptoms worse.  I am not sure that these fit correctly.  She does elevate her legs while she is sitting and this does not help.  She had a fairly extensive work-up.  On 02/06/2019 she had a duplex scan which showed no evidence of left lower extremity DVT.  On 02/25/2019 she had a CT scan which was unremarkable.  These results are described below.  She is otherwise fairly healthy.  She denies any previous abdominal surgery, inguinal surgery, or radiation therapy.  History reviewed. No pertinent past medical history.  History reviewed. No pertinent family history.  SOCIAL HISTORY: Social History   Socioeconomic History  . Marital status: Single    Spouse name: Not on file  . Number of children: Not on file  . Years of education: Not on file  . Highest education level: Not on file  Occupational History  . Not on file  Tobacco  Use  . Smoking status: Never Smoker  . Smokeless tobacco: Never Used  Substance and Sexual Activity  . Alcohol use: Not on file  . Drug use: Not on file  . Sexual activity: Not on file  Other Topics Concern  . Not on file  Social History Narrative  . Not on file   Social Determinants of Health   Financial Resource Strain:   . Difficulty of Paying Living Expenses: Not on file  Food Insecurity:   . Worried About Programme researcher, broadcasting/film/video in the Last Year: Not on file  . Ran Out of Food in the Last Year: Not on file  Transportation Needs:   . Lack of Transportation (Medical): Not on file  . Lack of Transportation  (Non-Medical): Not on file  Physical Activity:   . Days of Exercise per Week: Not on file  . Minutes of Exercise per Session: Not on file  Stress:   . Feeling of Stress : Not on file  Social Connections:   . Frequency of Communication with Friends and Family: Not on file  . Frequency of Social Gatherings with Friends and Family: Not on file  . Attends Religious Services: Not on file  . Active Member of Clubs or Organizations: Not on file  . Attends Banker Meetings: Not on file  . Marital Status: Not on file  Intimate Partner Violence:   . Fear of Current or Ex-Partner: Not on file  . Emotionally Abused: Not on file  . Physically Abused: Not on file  . Sexually Abused: Not on file    Allergies  Allergen Reactions  . Banana   . Tomato     Current Outpatient Medications  Medication Sig Dispense Refill  . BEE POLLEN PO Take by mouth.    . Cholecalciferol (VITAMIN D3) 10 MCG (400 UNIT) CAPS Take by mouth.    . levocetirizine (XYZAL) 5 MG tablet Take 5 mg by mouth every evening.    . loratadine (CLARITIN) 10 MG tablet Take 10 mg by mouth daily.    . Multiple Vitamins-Minerals (MULTIVITAMIN PO) Take by mouth.     No current facility-administered medications for this visit.    REVIEW OF SYSTEMS:  [X]  denotes positive finding, [ ]  denotes negative finding Cardiac  Comments:  Chest pain or chest pressure:    Shortness of breath upon exertion:    Short of breath when lying flat:    Irregular heart rhythm:        Vascular    Pain in calf, thigh, or hip brought on by ambulation:    Pain in feet at night that wakes you up from your sleep:  x   Blood clot in your veins:    Leg swelling:  x       Pulmonary    Oxygen at home:    Productive cough:     Wheezing:         Neurologic    Sudden weakness in arms or legs:     Sudden numbness in arms or legs:     Sudden onset of difficulty speaking or slurred speech:    Temporary loss of vision in one eye:       Problems with dizziness:         Gastrointestinal    Blood in stool:     Vomited blood:         Genitourinary    Burning when urinating:     Blood in urine:  Psychiatric    Major depression:         Hematologic    Bleeding problems:    Problems with blood clotting too easily:        Skin    Rashes or ulcers:        Constitutional    Fever or chills:     PHYSICAL EXAM:   Vitals:   03/16/20 1443  BP: (!) 147/88  Pulse: 63  Resp: 18  Temp: 98.5 F (36.9 C)  TempSrc: Temporal  SpO2: 98%  Weight: 192 lb 6.4 oz (87.3 kg)  Height: 5\' 3"  (1.6 m)   Body mass index is 34.08 kg/m.  GENERAL: The patient is a well-nourished female, in no acute distress. The vital signs are documented above. CARDIAC: There is a regular rate and rhythm.  VASCULAR: I do not detect carotid bruits. She has palpable dorsalis pedis pulses bilaterally. She has biphasic dorsalis pedis and posterior tibial signals bilaterally. She has some mild swelling but on my measurement the calves are both 42-1/2 cm in diameter. PULMONARY: There is good air exchange bilaterally without wheezing or rales. ABDOMEN: Soft and non-tender with normal pitched bowel sounds.  MUSCULOSKELETAL: There are no major deformities or cyanosis. NEUROLOGIC: No focal weakness or paresthesias are detected. SKIN: There are no ulcers or rashes noted. PSYCHIATRIC: The patient has a normal affect.  DATA:    VENOUS DUPLEX: I have independently interpreted her venous duplex of the left lower extremity.   There is no evidence of DVT and no evidence of superficial venous thrombosis. There is no deep venous reflux. There is no significant superficial venous reflux.

## 2020-06-06 DIAGNOSIS — N840 Polyp of corpus uteri: Secondary | ICD-10-CM | POA: Insufficient documentation

## 2020-07-15 ENCOUNTER — Encounter (HOSPITAL_COMMUNITY): Payer: Self-pay | Admitting: Emergency Medicine

## 2020-07-15 ENCOUNTER — Ambulatory Visit (INDEPENDENT_AMBULATORY_CARE_PROVIDER_SITE_OTHER): Payer: 59

## 2020-07-15 ENCOUNTER — Other Ambulatory Visit: Payer: Self-pay

## 2020-07-15 ENCOUNTER — Ambulatory Visit (HOSPITAL_COMMUNITY)
Admission: EM | Admit: 2020-07-15 | Discharge: 2020-07-15 | Disposition: A | Discharge status: Home / Self Care | Payer: 59 | Attending: Student | Admitting: Student

## 2020-07-15 DIAGNOSIS — R062 Wheezing: Secondary | ICD-10-CM | POA: Diagnosis not present

## 2020-07-15 DIAGNOSIS — J3089 Other allergic rhinitis: Secondary | ICD-10-CM | POA: Diagnosis not present

## 2020-07-15 DIAGNOSIS — R7303 Prediabetes: Secondary | ICD-10-CM | POA: Diagnosis present

## 2020-07-15 DIAGNOSIS — Z20822 Contact with and (suspected) exposure to covid-19: Secondary | ICD-10-CM | POA: Diagnosis present

## 2020-07-15 DIAGNOSIS — R0602 Shortness of breath: Secondary | ICD-10-CM | POA: Diagnosis not present

## 2020-07-15 DIAGNOSIS — R519 Headache, unspecified: Secondary | ICD-10-CM

## 2020-07-15 DIAGNOSIS — G43701 Chronic migraine without aura, not intractable, with status migrainosus: Secondary | ICD-10-CM | POA: Insufficient documentation

## 2020-07-15 DIAGNOSIS — J189 Pneumonia, unspecified organism: Secondary | ICD-10-CM | POA: Insufficient documentation

## 2020-07-15 LAB — CBG MONITORING, ED: Glucose-Capillary: 98 mg/dL (ref 70–99)

## 2020-07-15 MED ORDER — PREDNISONE 20 MG PO TABS: 40.0000 mg | ORAL_TABLET | Freq: Every day | ORAL | 0 refills | Status: AC

## 2020-07-15 MED ORDER — AZITHROMYCIN 250 MG PO TABS: 250.0000 mg | ORAL_TABLET | ORAL | 0 refills | Status: DC

## 2020-07-15 NOTE — ED Triage Notes (Signed)
Patient c/o headache and wheezing x 1 week.   Patient denies any fever at home.   Patient states her family member has tested positive for COVID.   Patient endorses a history of migraines.   Patient has taken Ibuprofen w/ "not any" relief of symptoms.

## 2020-07-15 NOTE — Discharge Instructions (Addendum)
-  Z-pack: two pills on day one, then one pill per day for days 2-5 -Prednisone, two pills daily for 5 days. You can take both pills together in the morning.  -Continue allergy medications. -Head straight to ER if worst headache of life, 10 out of 10 headache that persists, loss of consciousness, weakness or tingling in arms or legs.  We are currently awaiting result of your PCR covid-19 test. This typically comes back in 1-2 days. We'll call you if the result is positive. Otherwise, the result will be sent electronically to your MyChart. You can also call this clinic and ask for your result via telephone.   Please isolate at home while awaiting these results. If your test is positive for Covid-19, continue to isolate at home for 5 days if you have mild symptoms, or a total of 10 days from symptom onset if you have more severe symptoms. If you quarantine for a shorter period of time (i.e. 5 days), make sure to wear a mask until day 10 of symptoms. Treat your symptoms at home with OTC remedies like tylenol/ibuprofen, mucinex, nyquil, etc. Seek medical attention if you develop high fevers, chest pain, shortness of breath, ear pain, facial pain, etc. Make sure to get up and move around every 2-3 hours while convalescing to help prevent blood clots. Drink plenty of fluids, and rest as much as possible.

## 2020-07-15 NOTE — ED Provider Notes (Addendum)
[facility]    CSN: [id number] Arrival date & time: 07/15/20  1001      History   Chief Complaint Chief Complaint  Patient presents with  . Headache  . Wheezing    HPI Alison Cook is a 60 y.o. female Patient c/o headache and wheezing x 1 week, following positive Covid exposure.  History of migraines.  History of seasonal allergies controlled on Claritin and Xyzal.  Today she presents with wheezing and headaches.  She states that she has felt that she is wheezing at night for 1 week since her Covid exposure.  She denies history of cardiovascular or pulmonary disease, however she states that she intermittently wheezes when her allergies are acting up.  States that she is feeling short of breath.  Denies chest pain.  denies cough.,  Fevers chills, nausea vomiting diarrhea, chest pain  Patient also describes episodes of intermittent headaches behind her eye.  Describes them as sharp and very painful.  Patient with history of chronic migraines, currently well controlled on over-the-counter medications.  Denies jaw pain, denies scalp pain, denies neck stiffness.  She states she already has an appointment with primary care to discuss this in 3 days.  However given covid exposure she may not be able to keep this appointment. Patient has taken Ibuprofen w/ "not any" relief of symptoms.   Denies worst headache of life denies weakness or tingling in arms or legs denies thunderclap headache.  HPI  History reviewed. No pertinent past medical history.  There are no problems to display for this patient.   History reviewed. No pertinent surgical history.  OB History   No obstetric history on file.      Home Medications    Prior to Admission medications   Medication Sig Start Date End Date Taking? Authorizing Provider  azithromycin (ZITHROMAX Z-PAK) 250 MG tablet Take 1 tablet (250 mg total) by mouth as directed. Z-pack: two pills on day 1, then one pill per day for days 2-5  07/15/20  Yes Rhys Martini, PA-C  Cholecalciferol (VITAMIN D3) 10 MCG (400 UNIT) CAPS Take by mouth.   Yes [provider]  Multiple Vitamins-Minerals (MULTIVITAMIN PO) Take by mouth.   Yes [provider]  predniSONE (DELTASONE) 20 MG tablet Take 2 tablets (40 mg total) by mouth daily for 5 days. 07/15/20 07/20/20 Yes Rhys Martini, PA-C  BEE POLLEN PO Take by mouth.    [provider]  levocetirizine (XYZAL) 5 MG tablet Take 5 mg by mouth every evening.    [provider]  loratadine (CLARITIN) 10 MG tablet Take 10 mg by mouth daily.    [provider]    Family History History reviewed. No pertinent family history.  Social History Social History   Tobacco Use  . Smoking status: Never Smoker  . Smokeless tobacco: Never Used     Allergies   Mucinex fast-max, Banana, and Tomato   Review of Systems Review of Systems  Constitutional: Negative for appetite change, chills and fever.  HENT: Negative for congestion, ear pain, rhinorrhea, sinus pressure, sinus pain and sore throat.   Eyes: Negative for redness and visual disturbance.  Respiratory: Positive for shortness of breath and wheezing. Negative for cough and chest tightness.   Cardiovascular: Negative for chest pain and palpitations.  Gastrointestinal: Negative for abdominal pain, constipation, diarrhea, nausea and vomiting.  Genitourinary: Negative for dysuria, frequency and urgency.  Musculoskeletal: Negative for myalgias.  Neurological: Negative for dizziness, weakness and headaches.  Psychiatric/Behavioral: Negative for confusion.  All other systems reviewed and are negative.    Physical Exam Triage Vital Signs ED Triage Vitals  Enc Vitals Group     BP 07/15/20 1013 (!) 143/90     Pulse Rate 07/15/20 1013 82     Resp 07/15/20 1013 16     Temp 07/15/20 1013 98.2 F (36.8 C)     Temp Source 07/15/20 1013 Oral     SpO2 07/15/20 1013 97 %     Weight 07/15/20 1011 192 lb  7.4 oz (87.3 kg)     Height 07/15/20 1011 5\' 3"  (1.6 m)     Head Circumference --      Peak Flow --      Pain Score 07/15/20 1010 4     Pain Loc --      Pain Edu? --      Excl. in GC? --    No data found.  Updated Vital Signs BP (!) 143/90 (BP Location: Right Arm)   Pulse 82   Temp 98.2 F (36.8 C) (Oral)   Resp 16   Ht 5\' 3"  (1.6 m)   Wt 192 lb 7.4 oz (87.3 kg)   SpO2 97%   BMI 34.09 kg/m   Visual Acuity Right Eye Distance:   Left Eye Distance:   Bilateral Distance:    Right Eye Near:   Left Eye Near:    Bilateral Near:     Physical Exam Vitals reviewed.  Constitutional:      General: She is not in acute distress.    Appearance: Normal appearance. She is not ill-appearing.  HENT:     Head: Normocephalic and atraumatic.     Jaw: No tenderness or pain on movement.     Comments: No scalp tenderness     Right Ear: Hearing, tympanic membrane, ear canal and external ear normal. No swelling or tenderness. There is no impacted cerumen. No mastoid tenderness. Tympanic membrane is not perforated, erythematous, retracted or bulging.     Left Ear: Hearing, tympanic membrane, ear canal and external ear normal. No swelling or tenderness. There is no impacted cerumen. No mastoid tenderness. Tympanic membrane is not perforated, erythematous, retracted or bulging.     Nose:     Right Sinus: No maxillary sinus tenderness or frontal sinus tenderness.     Left Sinus: No maxillary sinus tenderness or frontal sinus tenderness.     Mouth/Throat:     Mouth: Mucous membranes are moist.     Pharynx: Uvula midline. No oropharyngeal exudate or posterior oropharyngeal erythema.     Tonsils: No tonsillar exudate.  Cardiovascular:     Rate and Rhythm: Normal rate and regular rhythm.     Heart sounds: Normal heart sounds.  Pulmonary:     Breath sounds: Normal air entry. Decreased breath sounds and wheezing present. No rhonchi or rales.     Comments: Scattered wheezes and decreased breath  sounds throughout Chest:     Chest wall: No tenderness.  Abdominal:     General: Abdomen is flat. Bowel sounds are normal.     Tenderness: There is no abdominal tenderness. There is no guarding or rebound.  Lymphadenopathy:     Cervical: No cervical adenopathy.  Neurological:     General: No focal deficit present.     Mental Status: She is alert and oriented to person, place, and time.  Psychiatric:        Attention and Perception: Attention and perception normal.  Mood and Affect: Mood and affect normal.        Behavior: Behavior normal. Behavior is cooperative.        Thought Content: Thought content normal.        Judgment: Judgment normal.      UC Treatments / Results  Labs (all labs ordered are listed, but only abnormal results are displayed) Labs Reviewed  SARS CORONAVIRUS 2 (TAT 6-24 HRS)    EKG   Radiology DG Chest 2 View  Result Date: 07/15/2020 CLINICAL DATA:  Wheezing and shortness breath.  COVID exposure. EXAM: CHEST - 2 VIEW COMPARISON:  None. FINDINGS: Heart size is normal. Patchy airspace opacities are present right middle lobe and lingula. No edema or effusion is present. Axial skeleton is within normal limits. IMPRESSION: Patchy airspace opacities in the right middle lobe and lingula, concerning for pneumonia. Electronically Signed   By: Marin Roberts M.D.   On: 07/15/2020 10:52    Procedures Procedures (including critical care time)  Medications Ordered in UC Medications - No data to display  Initial Impression / Assessment and Plan / UC Course  I have reviewed the triage vital signs and the nursing notes.  Pertinent labs & imaging results that were available during my care of the patient were reviewed by me and considered in my medical decision making (see chart for details).      afebrile nontachycardic nontachypneic, oxygenating well on room air. Scattered wheezes and decreased breath sounds throughout. No history pulmonary disease.    CXR - Patchy airspace opacities in the right middle lobe and lingula, concerning for pneumonia.  Suspect Covid pneumonia given positive Covid exposure.  Prednisone and z-pack as below. Patient with history of prediabetes- CBG today 98.  Return precautions- chest pain, shortness of breath, new/worsening fevers/chills, confusion, worsening of symptoms despite the above treatment plan, etc.   Spent over 40 minutes obtaining H&P, performing physical, interpreting films,  discussing results, treatment plan and plan for follow-up with patient. Patient agrees with plan.    This chart was dictated using voice recognition software, Dragon. Despite the best efforts of this provider to proofread and correct errors, errors may still occur which can change documentation meaning.     Final Clinical Impressions(s) / UC Diagnoses   Final diagnoses:  Suspected COVID-19 virus infection  Exposure to confirmed case of COVID-19  Chronic migraine without aura with status migrainosus, not intractable  Seasonal allergic rhinitis due to other allergic trigger  Pneumonia of right middle lobe due to infectious organism     Discharge Instructions     -Z-pack: two pills on day one, then one pill per day for days 2-5 -Prednisone, two pills daily for 5 days. You can take both pills together in the morning.  -Continue allergy medications. -Head straight to ER if worst headache of life, 10 out of 10 headache that persists, loss of consciousness, weakness or tingling in arms or legs.  We are currently awaiting result of your PCR covid-19 test. This typically comes back in 1-2 days. We'll call you if the result is positive. Otherwise, the result will be sent electronically to your MyChart. You can also call this clinic and ask for your result via telephone.   Please isolate at home while awaiting these results. If your test is positive for Covid-19, continue to isolate at home for 5 days if you have mild  symptoms, or a total of 10 days from symptom onset if you have more severe symptoms. If you quarantine  for a shorter period of time (i.e. 5 days), make sure to wear a mask until day 10 of symptoms. Treat your symptoms at home with OTC remedies like tylenol/ibuprofen, mucinex, nyquil, etc. Seek medical attention if you develop high fevers, chest pain, shortness of breath, ear pain, facial pain, etc. Make sure to get up and move around every 2-3 hours while convalescing to help prevent blood clots. Drink plenty of fluids, and rest as much as possible.     ED Prescriptions    Medication Sig Dispense Auth. Provider   predniSONE (DELTASONE) 20 MG tablet Take 2 tablets (40 mg total) by mouth daily for 5 days. 10 tablet Rhys Martini, PA-C   azithromycin (ZITHROMAX Z-PAK) 250 MG tablet Take 1 tablet (250 mg total) by mouth as directed. Z-pack: two pills on day 1, then one pill per day for days 2-5 6 tablet Rhys Martini, PA-C     PDMP not reviewed this encounter.   Rhys Martini, PA-C 07/15/20 1102    Rhys Martini, PA-C 07/15/20 1120

## 2020-07-16 LAB — SARS CORONAVIRUS 2 (TAT 6-24 HRS): SARS Coronavirus 2: NEGATIVE

## 2020-07-21 ENCOUNTER — Other Ambulatory Visit: Payer: Self-pay | Admitting: Obstetrics and Gynecology

## 2020-07-21 DIAGNOSIS — Z803 Family history of malignant neoplasm of breast: Secondary | ICD-10-CM

## 2020-08-13 ENCOUNTER — Ambulatory Visit
Admission: RE | Admit: 2020-08-13 | Discharge: 2020-08-13 | Disposition: A | Discharge status: Home / Self Care | Payer: PRIVATE HEALTH INSURANCE | Source: Ambulatory Visit | Attending: Obstetrics and Gynecology | Admitting: Obstetrics and Gynecology

## 2020-08-13 ENCOUNTER — Other Ambulatory Visit: Payer: Self-pay

## 2020-08-13 DIAGNOSIS — Z803 Family history of malignant neoplasm of breast: Secondary | ICD-10-CM

## 2020-08-13 MED ORDER — GADOBUTROL 1 MMOL/ML IV SOLN
8.0000 mL | Freq: Once | INTRAVENOUS | Status: AC | PRN
  Administered 2020-08-13: 8 mL via INTRAVENOUS

## 2020-08-14 DIAGNOSIS — Z9189 Other specified personal risk factors, not elsewhere classified: Secondary | ICD-10-CM | POA: Insufficient documentation

## 2020-08-25 ENCOUNTER — Ambulatory Visit (INDEPENDENT_AMBULATORY_CARE_PROVIDER_SITE_OTHER): Payer: 59

## 2020-08-25 ENCOUNTER — Encounter (HOSPITAL_COMMUNITY): Payer: Self-pay

## 2020-08-25 ENCOUNTER — Ambulatory Visit (HOSPITAL_COMMUNITY)
Admission: EM | Admit: 2020-08-25 | Discharge: 2020-08-25 | Disposition: A | Discharge status: Home / Self Care | Payer: 59

## 2020-08-25 ENCOUNTER — Other Ambulatory Visit: Payer: Self-pay

## 2020-08-25 DIAGNOSIS — R059 Cough, unspecified: Secondary | ICD-10-CM

## 2020-08-25 HISTORY — DX: Essential (primary) hypertension: I10

## 2020-08-25 MED ORDER — BENZONATATE 100 MG PO CAPS: 100.0000 mg | ORAL_CAPSULE | Freq: Three times a day (TID) | ORAL | 0 refills | Status: AC | PRN

## 2020-08-25 NOTE — ED Triage Notes (Signed)
Pt in with c/o productive cough, lower back pain, and watery eyes that has been going on for about 2 weeks now  Pt was recently dx with pneumonia on  Last visit and was prescribed z pak and antibiotics but states her sxs have returned

## 2020-08-25 NOTE — ED Provider Notes (Signed)
[facility]  ____________________________________________  Time seen: Approximately 1:13 PM  I have reviewed the triage vital signs and the nursing notes.   HISTORY  Chief Complaint Wheezing, Cough, watery eyes, and Back Pain   Historian Patient     HPI Alison Cook is a 60 y.o. female presents to the urgent care with persistent cough, watery eyes and low back pain.  Patient states that she has been sleeping propped up at night as she has had nasal congestion.  She reports that she was seen and evaluated in February was diagnosed with community-acquired pneumonia and was started on azithromycin and prednisone.  She reports that her symptoms seem to resolve but her cough has returned.  She denies fever or chills.  No nausea, vomiting or abdominal pain.  No other alleviating measures have been attempted.   Past Medical History:  Diagnosis Date  . Hypertension      Immunizations up to date:  Yes.     Past Medical History:  Diagnosis Date  . Hypertension     There are no problems to display for this patient.   History reviewed. No pertinent surgical history.  Prior to Admission medications   Medication Sig Start Date End Date Taking? Authorizing Provider  benzonatate (TESSALON PERLES) 100 MG capsule Take 1 capsule (100 mg total) by mouth 3 (three) times daily as needed for up to 7 days for cough. 08/25/20 09/01/20 Yes Pia Mau M, PA-C  azithromycin (ZITHROMAX Z-PAK) 250 MG tablet Take 1 tablet (250 mg total) by mouth as directed. Z-pack: two pills on day 1, then one pill per day for days 2-5 07/15/20   Rhys Martini, PA-C  BEE POLLEN PO Take by mouth.    [provider]  Cholecalciferol (VITAMIN D3) 10 MCG (400 UNIT) CAPS Take by mouth.    [provider]  levocetirizine (XYZAL) 5 MG tablet Take 5 mg by mouth every evening.    [provider]  loratadine (CLARITIN) 10 MG tablet Take 10 mg by mouth daily.    [provider]  Multiple Vitamins-Minerals (MULTIVITAMIN PO) Take by mouth.    [provider]  olmesartan (BENICAR) 40 MG tablet Take 40 mg by mouth daily. 07/20/20   [provider]    Allergies Mucinex fast-max, Banana, and Tomato  History reviewed. No pertinent family history.  Social History Social History   Tobacco Use  . Smoking status: Never Smoker  . Smokeless tobacco: Never Used  Substance Use Topics  . Drug use: Never     Review of Systems  Constitutional: No fever/chills Eyes:  No discharge ENT: No upper respiratory complaints. Respiratory: Patient has cough. No SOB/ use of accessory muscles to breath Gastrointestinal:   No nausea, no vomiting.  No diarrhea.  No constipation. Musculoskeletal: Negative for musculoskeletal pain. Skin: Negative for rash, abrasions, lacerations, ecchymosis.   ____________________________________________   PHYSICAL EXAM:  VITAL SIGNS: ED Triage Vitals  Enc Vitals Group     BP 08/25/20 1254 (!) 156/94     Pulse Rate 08/25/20 1254 79     Resp 08/25/20 1254 17     Temp 08/25/20 1254 99.1 F (37.3 C)     Temp src --      SpO2 08/25/20 1254 95 %     Weight --      Height --      Head Circumference --      Peak Flow --      Pain Score 08/25/20  1252 3     Pain Loc --      Pain Edu? --      Excl. in GC? --      Constitutional: Alert and oriented. Well appearing and in no acute distress. Eyes: Conjunctivae are normal. PERRL. EOMI. Head: Atraumatic. ENT:      Nose: No congestion/rhinnorhea.      Mouth/Throat: Mucous membranes are moist.  Neck: No stridor.  No cervical spine tenderness to palpation. Cardiovascular: Normal rate, regular rhythm. Normal S1 and S2.  Good peripheral circulation. Respiratory: Normal respiratory effort without tachypnea or retractions. Lungs CTAB. Good air entry to the bases with no decreased or absent breath sounds Gastrointestinal: Bowel sounds x 4 quadrants. Soft and  nontender to palpation. No guarding or rigidity. No distention. Musculoskeletal: Full range of motion to all extremities. No obvious deformities noted Neurologic:  Normal for age. No gross focal neurologic deficits are appreciated.  Skin:  Skin is warm, dry and intact. No rash noted. Psychiatric: Mood and affect are normal for age. Speech and behavior are normal.   ____________________________________________   LABS (all labs ordered are listed, but only abnormal results are displayed)  Labs Reviewed - No data to display ____________________________________________  EKG   ____________________________________________  RADIOLOGY Geraldo Pitter, personally viewed and evaluated these images (plain radiographs) as part of my medical decision making, as well as reviewing the written report by the radiologist.    No results found.  ____________________________________________    PROCEDURES  Procedure(s) performed:     Procedures     Medications - No data to display   ____________________________________________   INITIAL IMPRESSION / ASSESSMENT AND PLAN / ED COURSE  Pertinent labs & imaging results that were available during my care of the patient were reviewed by me and considered in my medical decision making (see chart for details).      Assessment and Plan:  Cough:  60 year old female presents to the urgent care with cough, increased tearing and throat pruritus for the past several days.  Patient was hypertensive at triage but vital signs otherwise reassuring.  She had no wheezing on exam or other adventitious lung sounds auscultated.  Chest x-ray shows no consolidations, opacities or infiltrates.  Recommended Tessalon Perles and Mucinex DM for cough.  Patient is currently taking an antihistamine at night and recommended continuing medication.  Return precautions were given to return with new or worsening symptoms.  All patient questions were  answered.     ____________________________________________  FINAL CLINICAL IMPRESSION(S) / ED DIAGNOSES  Final diagnoses:  Cough      NEW MEDICATIONS STARTED DURING THIS VISIT:  ED Discharge Orders         Ordered    benzonatate (TESSALON PERLES) 100 MG capsule  3 times daily PRN        08/25/20 1517              This chart was dictated using voice recognition software/Dragon. Despite best efforts to proofread, errors can occur which can change the meaning. Any change was purely unintentional.     Orvil Feil, PA-C 08/25/20 1519

## 2020-08-25 NOTE — Discharge Instructions (Addendum)
I will contact you by phone with xray results.

## 2020-09-07 ENCOUNTER — Encounter (INDEPENDENT_AMBULATORY_CARE_PROVIDER_SITE_OTHER): Payer: Self-pay | Admitting: Family Medicine

## 2020-09-07 ENCOUNTER — Ambulatory Visit (INDEPENDENT_AMBULATORY_CARE_PROVIDER_SITE_OTHER): Payer: 59 | Admitting: Family Medicine

## 2020-09-07 ENCOUNTER — Other Ambulatory Visit: Payer: Self-pay

## 2020-09-07 ENCOUNTER — Telehealth (INDEPENDENT_AMBULATORY_CARE_PROVIDER_SITE_OTHER): Payer: Self-pay

## 2020-09-07 ENCOUNTER — Encounter (INDEPENDENT_AMBULATORY_CARE_PROVIDER_SITE_OTHER): Payer: Self-pay

## 2020-09-07 VITALS — BP 110/70 | HR 75 | Temp 98.1°F | Ht 63.0 in | Wt 191.0 lb

## 2020-09-07 DIAGNOSIS — E559 Vitamin D deficiency, unspecified: Secondary | ICD-10-CM | POA: Diagnosis not present

## 2020-09-07 DIAGNOSIS — Z9189 Other specified personal risk factors, not elsewhere classified: Secondary | ICD-10-CM

## 2020-09-07 DIAGNOSIS — Z1331 Encounter for screening for depression: Secondary | ICD-10-CM

## 2020-09-07 DIAGNOSIS — Z6833 Body mass index (BMI) 33.0-33.9, adult: Secondary | ICD-10-CM | POA: Insufficient documentation

## 2020-09-07 DIAGNOSIS — R5383 Other fatigue: Secondary | ICD-10-CM | POA: Diagnosis not present

## 2020-09-07 DIAGNOSIS — F39 Unspecified mood [affective] disorder: Secondary | ICD-10-CM

## 2020-09-07 DIAGNOSIS — R0602 Shortness of breath: Secondary | ICD-10-CM

## 2020-09-07 DIAGNOSIS — Z0289 Encounter for other administrative examinations: Secondary | ICD-10-CM

## 2020-09-07 DIAGNOSIS — I10 Essential (primary) hypertension: Secondary | ICD-10-CM | POA: Diagnosis not present

## 2020-09-07 DIAGNOSIS — E669 Obesity, unspecified: Secondary | ICD-10-CM

## 2020-09-07 NOTE — Patient Instructions (Signed)
Suicidal Feelings: How to Help Yourself Suicide is when you end your own life. There are many things you can do to help yourself feel better when struggling with these feelings. Many services and people are available to support you and others who struggle with similar feelings.  If you ever feel like you may hurt yourself or others, or have thoughts about taking your own life, get help right away. To get help:  Call your local emergency services (911 in the [place]  [employer]'s health and human services helpline (211 in the [place]  Go to your nearest emergency department.  Call a suicide hotline to speak with a trained counselor. The following suicide hotlines are available in the [place]: ? 1-800-273-TALK (1-800-273-8255). ? 1-800-SUICIDE (1-800-784-2433). ? 1-888-628-9454. This is a hotline for Spanish speakers. ? 1-800-799-4889. This is a hotline for TTY users. ? 1-866-4-U-TREVOR (1-866-488-7386). This is a hotline for lesbian, gay, bisexual, transgender, or questioning youth. ? For a list of hotlines in [place], visit www.suicide.org/hotlines/international/[place]-suicide-hotlines.html  Contact a crisis center or a local suicide prevention center. To find a crisis center or suicide prevention center: ? Call your local hospital, clinic, community service organization, mental health center, social service provider, or health department. Ask for help with connecting to a crisis center. ? For a list of crisis centers in the [place], visit: suicidepreventionlifeline.org ? For a list of crisis centers in [place], visit: suicideprevention.ca How to help yourself feel better  Promise yourself that you will not do anything extreme when you have suicidal feelings. Remember the times you have felt hopeful. Many people have gotten through suicidal thoughts and feelings, and you can too. If you have had these feelings before, remind yourself that you can get through them again.  Let  family, friends, teachers, or counselors know how you are feeling. Try not to separate yourself from those who care about you and want to help you. Talk with someone every day, even if you do not feel sociable. Face-to-face conversation is best to help them understand your feelings.  Contact a mental health care provider and work with this person regularly.  Make a safety plan that you can follow during a crisis. Include phone numbers of suicide prevention hotlines, mental health professionals, and trusted friends and family members you can call during an emergency. Save these numbers on your phone.  If you are thinking of taking a lot of medicine, give your medicine to someone who can give it to you as prescribed. If you are on antidepressants and are concerned you will overdose, tell your health care provider so that he or she can give you safer medicines.  Try to stick to your routines and follow a schedule every day. Make self-care a priority.  Make a list of realistic goals, and cross them off when you achieve them. Accomplishments can give you a sense of worth.  Wait until you are feeling better before doing things that you find difficult or unpleasant.  Do things that you have always enjoyed to take your mind off your feelings. Try reading a book, or listening to or playing music. Spending time outside, in nature, may help you feel better.   Follow these instructions at home:  Visit your primary health care provider every year for a checkup.  Work with a mental health care provider as needed.  Eat a well-balanced diet, and eat regular meals.  Get plenty of rest.  Exercise if you are able. Just 30 minutes of   exercise each day can help you feel better.  Take over-the-counter and prescription medicines only as told by your health care provider. Ask your mental health care provider about the possible side effects of any medicines you are taking.  Do not use alcohol or drugs, and  remove these substances from your home.  Remove weapons, poisons, knives, and other deadly items from your home.   General recommendations  Keep your living space well lit.  When you are feeling well, write yourself a letter with tips and support that you can read when you are not feeling well.  Remember that life's difficulties can be sorted out with help. Conditions can be treated, and you can learn behaviors and ways of thinking that will help you. Where to find more information  National Suicide Prevention Lifeline: www.suicidepreventionlifeline.org  Hopeline: www.hopeline.com  American Foundation for Suicide Prevention: www.afsp.org  The Trevor Project (for lesbian, gay, bisexual, transgender, or questioning youth): www.thetrevorproject.org  National Institute of Mental Health: https://www.nimh.nih.gov/health/topics/suicide-prevention Contact a health care provider if:  You feel as though you are a burden to others.  You feel agitated, angry, vengeful, or have extreme mood swings.  You have withdrawn from family and friends. Get help right away if:  You are talking about suicide or wishing to die.  You start making plans for how to commit suicide.  You feel that you have no reason to live.  You start making plans for putting your affairs in order, saying goodbye, or giving your possessions away.  You feel guilt, shame, or unbearable pain, and it seems like there is no way out.  You are frequently using drugs or alcohol.  You are engaging in risky behaviors that could lead to death. If you have any of these symptoms, get help right away. Call emergency services, go to your nearest emergency department or crisis center, or call a suicide crisis helpline. Summary  Suicide is when you take your own life.  Promise yourself that you will not do anything extreme when you have suicidal feelings.  Let family, friends, teachers, or counselors know how you are  feeling.  Get help right away if you start making plans for how to commit suicide. This information is not intended to replace advice given to you by your health care provider. Make sure you discuss any questions you have with your health care provider. Document Revised: 02/11/2020 Document Reviewed: 02/11/2020 Elsevier Patient Education  2021 [employer].  

## 2020-09-07 NOTE — Telephone Encounter (Signed)
Call and spoke to patient and gave her the phone numbers that she needs to obtain the help she needs.  Pt has agreed to call or to walk into the crisis clinic if needed.  My chart message sent with the information below.   [facility] (walk in [place]) [address] [place], [place] [address] [phone]   [facility] (Establish with a psychiatrist here) [address] [place], [place] M-Th 8am-5pm Friday, 8-1pm   [facility] [phone]) They will send a counselor and a therapist to your home, but you have to call them. They will make sure you get to where you need to go for care. [phone]

## 2020-09-08 LAB — COMPREHENSIVE METABOLIC PANEL
ALT: 20 IU/L (ref 0–32)
AST: 23 IU/L (ref 0–40)
Albumin/Globulin Ratio: 1.5 (ref 1.2–2.2)
Albumin: 4.5 g/dL (ref 3.8–4.9)
Alkaline Phosphatase: 109 IU/L (ref 44–121)
BUN/Creatinine Ratio: 19 (ref 9–23)
BUN: 13 mg/dL (ref 6–24)
Bilirubin Total: 0.6 mg/dL (ref 0.0–1.2)
CO2: 23 mmol/L (ref 20–29)
Calcium: 9.2 mg/dL (ref 8.7–10.2)
Chloride: 101 mmol/L (ref 96–106)
Creatinine, Ser: 0.69 mg/dL (ref 0.57–1.00)
Globulin, Total: 3.1 g/dL (ref 1.5–4.5)
Glucose: 90 mg/dL (ref 65–99)
Potassium: 4.5 mmol/L (ref 3.5–5.2)
Sodium: 142 mmol/L (ref 134–144)
Total Protein: 7.6 g/dL (ref 6.0–8.5)
eGFR: 100 mL/min/{1.73_m2} (ref >59)

## 2020-09-08 LAB — LIPID PANEL
Chol/HDL Ratio: 2.8 ratio (ref 0.0–4.4)
Cholesterol, Total: 200 mg/dL — ABNORMAL HIGH (ref 100–199)
HDL: 71 mg/dL (ref >39)
LDL Chol Calc (NIH): 117 mg/dL — ABNORMAL HIGH (ref 0–99)
Triglycerides: 68 mg/dL (ref 0–149)
VLDL Cholesterol Cal: 12 mg/dL (ref 5–40)

## 2020-09-08 LAB — HEMOGLOBIN A1C
Est. average glucose Bld gHb Est-mCnc: 114 mg/dL
Hgb A1c MFr Bld: 5.6 % (ref 4.8–5.6)

## 2020-09-08 LAB — CBC WITH DIFFERENTIAL/PLATELET
Basophils Absolute: 0.1 10*3/uL (ref 0.0–0.2)
Basos: 1 %
EOS (ABSOLUTE): 0.7 10*3/uL — ABNORMAL HIGH (ref 0.0–0.4)
Eos: 8 %
Hematocrit: 40.1 % (ref 34.0–46.6)
Hemoglobin: 12.6 g/dL (ref 11.1–15.9)
Immature Grans (Abs): 0 10*3/uL (ref 0.0–0.1)
Immature Granulocytes: 0 %
Lymphocytes Absolute: 2.5 10*3/uL (ref 0.7–3.1)
Lymphs: 27 %
MCH: 27.8 pg (ref 26.6–33.0)
MCHC: 31.4 g/dL — ABNORMAL LOW (ref 31.5–35.7)
MCV: 89 fL (ref 79–97)
Monocytes Absolute: 0.6 10*3/uL (ref 0.1–0.9)
Monocytes: 7 %
Neutrophils Absolute: 5.3 10*3/uL (ref 1.4–7.0)
Neutrophils: 57 %
Platelets: 230 10*3/uL (ref 150–450)
RBC: 4.53 x10E6/uL (ref 3.77–5.28)
RDW: 14.2 % (ref 11.7–15.4)
WBC: 9.2 10*3/uL (ref 3.4–10.8)

## 2020-09-08 LAB — VITAMIN B12: Vitamin B-12: 645 pg/mL (ref 232–1245)

## 2020-09-08 LAB — T3: T3, Total: 153 ng/dL (ref 71–180)

## 2020-09-08 LAB — INSULIN, RANDOM: INSULIN: 8.2 u[IU]/mL (ref 2.6–24.9)

## 2020-09-08 LAB — FOLATE: Folate: 11.9 ng/mL (ref >3.0)

## 2020-09-08 LAB — TSH: TSH: 0.801 u[IU]/mL (ref 0.450–4.500)

## 2020-09-08 LAB — T4, FREE: Free T4: 1.24 ng/dL (ref 0.82–1.77)

## 2020-09-08 LAB — VITAMIN D 25 HYDROXY (VIT D DEFICIENCY, FRACTURES): Vit D, 25-Hydroxy: 26.5 ng/mL — ABNORMAL LOW (ref 30.0–100.0)

## 2020-09-17 NOTE — Progress Notes (Signed)
Chief Complaint:   Alison Cook (MR# [id number]) is a 60 y.o. female who presents for evaluation and treatment of Alison and related comorbidities. Current BMI is Body mass index is 33.83 kg/m. Alison Cook has been struggling with Alison Cook weight for many years and has been unsuccessful in either losing weight, maintaining weight loss, or reaching Alison Cook healthy weight goal.  Alison Cook is currently in the action stage of change and ready to dedicate time achieving and maintaining a healthier weight. Alison Cook is interested in becoming our patient and working on intensive lifestyle modifications including (but not limited to) diet and exercise for weight loss.  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  Alison Cook lives with Alison Cook 1 year old daughter, Alison Cook.  Craves sweet/salty foods.  Snacks on tortilla chips and salsa, fruit, popcorn.  Alison Cook is tearful during Alison Cook visit today after we discussed Alison Cook PHQ results.  Alison Cook says Alison Cook has never told anyone Alison Cook feels this low.  Alison Cook mother died this past summer, which sparked these feelings.  Alison Cook habits were reviewed today and are as follows: Alison Cook family eats meals together, Alison Cook desired weight loss is 41 pounds, Alison Cook started gaining weight at 60 years of age, Alison Cook heaviest weight ever was 195 pounds, Alison Cook craves sweet and salty foods, Alison Cook frequently makes poor food choices, Alison Cook frequently eats larger portions than normal and Alison Cook struggles with emotional eating.  Depression Screen Alison Cook's Food and Mood (modified PHQ-9) score was 19.  Depression screen PHQ 2/9 09/07/2020  Decreased Interest 2  Down, Depressed, Hopeless 2  PHQ - 2 Score 4  Altered sleeping 3  Tired, decreased energy 3  Change in appetite 2  Feeling bad or failure about yourself  2  Trouble concentrating 2  Moving slowly or fidgety/restless 2  Suicidal thoughts 1  PHQ-9 Score 19  Difficult doing work/chores Very difficult     Assessment/Plan:   Orders Placed This Encounter  Procedures  . Vitamin B12  .  CBC with Differential/Platelet  . Comprehensive metabolic panel  . Folate  . Hemoglobin A1c  . Insulin, random  . Lipid panel  . T3  . T4, free  . TSH  . VITAMIN D 25 Hydroxy (Vit-D Deficiency, Fractures)  . Ambulatory referral to [facility]  . Ambulatory referral to Psychiatry  . EKG 12-Lead    Medications Discontinued During This Encounter  Medication Reason  . azithromycin (ZITHROMAX Z-PAK) 250 MG tablet   . Multiple Vitamins-Minerals (MULTIVITAMIN PO)       1. Other fatigue Alison Cook admits to daytime somnolence and reports waking up still tired. Patent has a history of symptoms of daytime fatigue, morning fatigue, morning headache and snoring. Alison Cook generally gets 4 or 5 hours of sleep per night, and states that Alison Cook has poor quality sleep. Snoring is present. Apneic episodes are present. Epworth Sleepiness Score is 15.  Shany does feel that Alison Cook weight is causing Alison Cook energy to be lower than it should be. Fatigue may be related to Alison, depression or many other causes. Labs will be ordered, and in the meanwhile, Alison Cook will focus on self care including making healthy food choices, increasing physical activity and focusing on stress reduction.  Alison Cook needs to see Alison Cook PCP ASAP regarding OSA evaluation and possible Sleep Medicine referral.  We discussed how important sleep is for weight loss.  Will check EKG and labs today.  - EKG 12-Lead - Vitamin B12 - CBC with Differential/Platelet - Comprehensive metabolic panel - Folate - Hemoglobin A1c - Insulin, random -  Lipid panel - T3 - T4, free - TSH - VITAMIN D 25 Hydroxy (Vit-D Deficiency, Fractures)  2. SOBOE (shortness of breath on exertion) Alison Cook notes increasing shortness of breath with exercising and seems to be worsening over time with weight gain. Alison Cook notes getting out of breath sooner with activity than Alison Cook used to. This has gotten worse recently. Alison Cook denies shortness of breath at rest or orthopnea.  Alison Cook  does feel that Alison Cook gets out of breath more easily that Alison Cook used to when Alison Cook exercises. Alison Cook shortness of breath appears to be Alison related and exercise induced. Alison Cook has agreed to work on weight loss and gradually increase exercise to treat Alison Cook exercise induced shortness of breath. Will continue to monitor closely.  Will check IC and labs today.  - Vitamin B12 - CBC with Differential/Platelet - Comprehensive metabolic panel - Folate - Hemoglobin A1c - Insulin, random - Lipid panel - T3 - T4, free - TSH - VITAMIN D 25 Hydroxy (Vit-D Deficiency, Fractures)  3. Essential hypertension At goal. Medications: olmesartan 40 mg daily.   Plan: Avoid buying foods that are: processed, frozen, or prepackaged to avoid excess salt. We will continue to monitor closely alongside Alison Cook PCP and/or Specialist.  Regular follow up with PCP and specialists was also encouraged.  Will check labs today.  BP Readings from Last 3 Encounters:  09/07/20 110/70  08/25/20 (!) 156/94  07/15/20 (!) 143/90   Lab Results  Component Value Date   CREATININE 0.69 09/07/2020   - Vitamin B12 - CBC with Differential/Platelet - Comprehensive metabolic panel - Folate - Hemoglobin A1c - Insulin, random - Lipid panel - T3 - T4, free - TSH - VITAMIN D 25 Hydroxy (Vit-D Deficiency, Fractures)  4. Vitamin D deficiency Alison Cook is taking OTC vitamin D 6,000 IU daily.  Optimal goal > 50 ng/dL.   Plan: Continue current OTC vitamin D supplementation. Will check vitamin D level today, as per below.  - VITAMIN D 25 Hydroxy (Vit-D Deficiency, Fractures)  5. Mood disorder, with emtional eating Uncontrolled.  Medication: None.  Plan:  With depression and stress.  Positive SI, but not currently.  Alison Cook has thought of ways to do it, but says Alison Cook wouldn't due to Alison Cook daughter.  Alison Cook mother passed in July 2021.  Plan:  Referral to Psychiatry for medical/medication management - URGENT!  [facility]!  Verbal agreement made with Alison Cook today with Darreld Mcleanrina as a witness that Alison Cook will not harm herself, etc.  Alison Cook will follow-up with Alison Cook PCP to discuss Alison Cook current state of mental health.  We will check labs today.  Alison Cook was given [facility] on [facility] to [facility] as needed.  - Ambulatory referral to Behavioral Health- URGENT - Ambulatory referral to Psychiatry- URGENT  6. Depression screening Alison Cook was screened for depression as part of Alison Cook new patient workup today.  PHQ-9 is 19.  Alison Cook had a positive depression screening. Depression is commonly associated with Alison and often results in emotional eating behaviors. We will monitor this closely and work on CBT to help improve the non-hunger eating patterns. Referral to Psychology may be required if no improvement is seen as Alison Cook continues in our clinic.  7. At risk for depression Alison Cook D Cook was given approximately 23 minutes of depression prevention counseling today due to their higher than average risk for this condition. The patient has several risk factors for depression such as chronic medical conditions, sleep  issues, major life stressors/events, etc., and we discussed these today.  Alison Cook was also counseled on the importance of a healthy work-life balance, a healthy relationship with food, and a good support system.  We discussed various strategies to help cope with these emotions as well.  I recommended counseling, meditation or prayer, healthy eating habits, sleep hygiene, and exercising to help manage these feelings.   8. Class 1 Alison with serious comorbidity and body mass index (BMI) of 33.0 to 33.9 in adult, unspecified Alison type  Alison Cook is currently in the action stage of change and Alison Cook goal is to continue with weight loss efforts. I recommend Alison Cook begin the structured treatment plan as follows:  Alison Cook has agreed to the Category 2 Plan.  Exercise goals: As is.    Behavioral modification strategies: increasing lean protein intake, meal planning and cooking strategies, keeping healthy foods in the home and planning for success.  Alison Cook was informed of the importance of frequent follow-up visits to maximize Alison Cook success with intensive lifestyle modifications for Alison Cook multiple health conditions. Alison Cook was informed we would discuss Alison Cook lab results at Alison Cook next visit unless there is a critical issue that needs to be addressed sooner. Alison Cook agreed to keep Alison Cook next visit at the agreed upon time to discuss these results.  Objective:   Blood pressure 110/70, pulse 75, temperature 98.1 F (36.7 C), height 5\' 3"  (1.6 m), weight 191 lb (86.6 kg), SpO2 96 %. Body mass index is 33.83 kg/m.  EKG: Normal sinus rhythm, rate 81 bpm.  Indirect Calorimeter completed today shows a VO2 of 218 and a REE of 1517.  Alison Cook calculated basal metabolic rate is thus Alison Cook basal metabolic rate is better than expected.  General: Cooperative, alert, well developed, in no acute distress. HEENT: Conjunctivae and lids unremarkable. Cardiovascular: Regular rhythm.  Lungs: Normal work of breathing. Neurologic: No focal deficits.   Lab Results  Component Value Date   CREATININE 0.69 09/07/2020   BUN 13 09/07/2020   NA 142 09/07/2020   K 4.5 09/07/2020   CL 101 09/07/2020   CO2 23 09/07/2020   Lab Results  Component Value Date   ALT 20 09/07/2020   AST 23 09/07/2020   ALKPHOS 109 09/07/2020   BILITOT 0.6 09/07/2020   Lab Results  Component Value Date   HGBA1C 5.6 09/07/2020   Lab Results  Component Value Date   INSULIN 8.2 09/07/2020   Lab Results  Component Value Date   TSH 0.801 09/07/2020   Lab Results  Component Value Date   CHOL 200 (H) 09/07/2020   HDL 71 09/07/2020   LDLCALC 117 (H) 09/07/2020   TRIG 68 09/07/2020   CHOLHDL 2.8 09/07/2020   Lab Results  Component Value Date   WBC 9.2 09/07/2020   HGB 12.6 09/07/2020   HCT 40.1 09/07/2020   MCV 89  09/07/2020   PLT 230 09/07/2020   Attestation Statements:   This is the patient's first visit at [facility]. The patient's NEW PATIENT PACKET was reviewed at length. Included in the packet: current and past health history, medications, allergies, ROS, gynecologic history (women only), surgical history, family history, social history, weight history, weight loss surgery history (for those that have had weight loss surgery), nutritional evaluation, mood and food questionnaire, PHQ9, Epworth questionnaire, sleep habits questionnaire, patient life and health improvement goals questionnaire. These will all be scanned into the patient's chart under media.   During the visit, I independently reviewed the  patient's EKG, bioimpedance scale results, and indirect calorimeter results. I used this information to tailor a meal plan for the patient that will help Alison Cook to lose weight and will improve Alison Cook Alison-related conditions going forward. I performed a medically necessary appropriate examination and/or evaluation. I discussed the assessment and treatment plan with the patient. The patient was provided an opportunity to ask questions and all were answered. The patient agreed with the plan and demonstrated an understanding of the instructions. Labs were ordered at this visit and will be reviewed at the next visit unless more critical results need to be addressed immediately. Clinical information was updated and documented in the EMR.   I, Insurance claims handler, CMA, am acting as Energy manager for [employer], DO.  I have reviewed the above documentation for accuracy and completeness, and I agree with the above. Carlye Grippe, D.O.  The 21st Century Cures Act was signed into law in 2016 which includes the topic of electronic health records.  This provides immediate access to information in MyChart.  This includes consultation notes, operative notes, office notes, lab results and pathology  reports.  If you have any questions about what you read please let us know at your next visit so we can discuss your concerns and take corrective action if need be.  We are right here with you.

## 2020-09-21 ENCOUNTER — Ambulatory Visit (INDEPENDENT_AMBULATORY_CARE_PROVIDER_SITE_OTHER): Payer: 59 | Admitting: Family Medicine

## 2020-09-26 ENCOUNTER — Ambulatory Visit (INDEPENDENT_AMBULATORY_CARE_PROVIDER_SITE_OTHER): Payer: 59 | Admitting: Family Medicine

## 2020-10-05 ENCOUNTER — Other Ambulatory Visit: Payer: Self-pay

## 2020-10-05 ENCOUNTER — Encounter (INDEPENDENT_AMBULATORY_CARE_PROVIDER_SITE_OTHER): Payer: Self-pay | Admitting: Family Medicine

## 2020-10-05 ENCOUNTER — Ambulatory Visit (INDEPENDENT_AMBULATORY_CARE_PROVIDER_SITE_OTHER): Payer: 59 | Admitting: Family Medicine

## 2020-10-05 VITALS — BP 110/67 | HR 63 | Temp 97.7°F | Ht 63.0 in | Wt 183.0 lb

## 2020-10-05 DIAGNOSIS — F329 Major depressive disorder, single episode, unspecified: Secondary | ICD-10-CM

## 2020-10-05 DIAGNOSIS — Z9189 Other specified personal risk factors, not elsewhere classified: Secondary | ICD-10-CM | POA: Diagnosis not present

## 2020-10-05 DIAGNOSIS — I10 Essential (primary) hypertension: Secondary | ICD-10-CM

## 2020-10-05 DIAGNOSIS — E7849 Other hyperlipidemia: Secondary | ICD-10-CM | POA: Diagnosis not present

## 2020-10-05 DIAGNOSIS — E559 Vitamin D deficiency, unspecified: Secondary | ICD-10-CM | POA: Diagnosis not present

## 2020-10-05 DIAGNOSIS — E8881 Metabolic syndrome: Secondary | ICD-10-CM | POA: Diagnosis not present

## 2020-10-05 DIAGNOSIS — Z6832 Body mass index (BMI) 32.0-32.9, adult: Secondary | ICD-10-CM

## 2020-10-05 DIAGNOSIS — E669 Obesity, unspecified: Secondary | ICD-10-CM

## 2020-10-05 MED ORDER — VITAMIN D (ERGOCALCIFEROL) 1.25 MG (50000 UNIT) PO CAPS
50000.0000 [IU] | ORAL_CAPSULE | ORAL | 0 refills | Status: DC
Start: 2020-10-05 — End: 2020-10-26

## 2020-10-09 ENCOUNTER — Ambulatory Visit (INDEPENDENT_AMBULATORY_CARE_PROVIDER_SITE_OTHER): Payer: 59 | Admitting: Family Medicine

## 2020-10-12 NOTE — Progress Notes (Signed)
Chief Complaint:   OBESITY Alison Cook is here to discuss her progress with her obesity treatment plan along with follow-up of her obesity related diagnoses.   Today's visit was #: 2 Starting weight: 191 lbs Starting date: 09/07/2020 Today's weight: 183 lbs Today's date: 10/05/2020 Total lbs lost to date: 8 lbs Body mass index is 32.42 kg/m.  Total weight loss percentage to date: -4.19%  Interim History:  Alison Cook is here today for her first follow-up office visit since starting the program with [place].  Patient has lost 8 pounds since last office visit.  All blood work/ lab tests that were recently ordered by myself or an outside provider were reviewed with patient today per their request.   Extended time was spent counseling [name] on all new disease processes that were discovered or that are worsening.  Patient was counseled on all abnormalities and we discussed dietary and lifestyle changes that could help those values (along with medications if/when appropriate).  Extensive health counseling performed and patient's concerns/ questions were addressed.   Pt understands that many of these abnormalities will need to monitored regularly along with the prudent dietary changes we are making each and every office visit  We reviewed her NEW Meal Plan in detail and questions were answered.    Patient's food recall appears to be accurate and consistent with what is on plan when she is following it.   When eating on plan, her hunger and cravings are well controlled.    Sia says that 25% of the time, she skipped some meals, usually breakfast or lunch and also did off plan eating.  She was hungry on the days she walked and skipped meals.  Current Meal Plan: the Category 2 Plan for 75% of the time.  Current Exercise Plan: Walking for 60 minutes 3-4 times per week.  Assessment/Plan:   1. Essential hypertension At goal. Medications: Benicar 40 mg daily.   Plan:  At goal.   Discussed labs with patient today.  Avoid buying foods that are: processed, frozen, or prepackaged to avoid excess salt. We will continue to monitor closely alongside her PCP and/or Specialist.  Regular follow up with PCP and specialists was also encouraged.   BP Readings from Last 3 Encounters:  10/05/20 110/67  09/07/20 110/70  08/25/20 (!) 156/94   Lab Results  Component Value Date   CREATININE 0.69 09/07/2020   2. Vitamin D deficiency Not at goal. Current vitamin D is 26.4, tested on 09/07/2020. Optimal goal > 50 ng/dL.   Plan:  Worsening.  Discussed labs with patient today.   - Discussed importance of vitamin D to their health and well-being.  - possible symptoms of low Vitamin D can be low energy, depressed mood, muscle aches, joint aches, osteoporosis etc. - low Vitamin D levels may be linked to an increased risk of cardiovascular events and even increased risk of cancers- such as colon and breast.  - I recommend pt take a once weekly prescription vit D - see script below   - Informed patient this may be a lifelong thing, and she was encouraged to continue to take the medicine until told otherwise.   - we will need to monitor levels regularly (every 3-4 mo on average) to keep levels within normal limits.  - weight loss will likely improve availability of vitamin D, thus encouraged Alison Cook to continue with meal plan and their weight loss efforts to further improve this condition - pt's questions and  concerns regarding this condition addressed.  - Start Vitamin D, Ergocalciferol, (DRISDOL) 1.25 MG (50000 UNIT) CAPS capsule; Take 1 capsule (50,000 Units total) by mouth every 7 (seven) days.  Dispense: 4 capsule; Refill: 0  3. Insulin resistance Not at goal. Goal is HgbA1c < 5.7, fasting insulin closer to 5.  Medication: None.    Plan:  New.  Discussed labs with patient today.  - I reiterated and again counseled patient on pathophysiology of the disease process of I.R. - Stressed  importance of dietary and lifestyle modifications resulting in weight loss as first line txmnt - in addition we discussed the risks and benefits of various medication options which can help [place] in the management of this disease process as well as with weight loss.  Will consider starting one of these meds in future as will focus on prudent nutritional plan at this time.  - continue to decrease simple carbs; increase fiber and proteins -> follow meal plan  - handouts provided at pt's request after education provided.  All concerns/questions addressed.   - anticipatory guidance given.   - Recheck A1c and fasting insulin level in approximately 3 months from last check or as deemed fit.   Lab Results  Component Value Date   HGBA1C 5.6 09/07/2020   Lab Results  Component Value Date   INSULIN 8.2 09/07/2020   4. Other hyperlipidemia Course: Not at goal. Lipid-lowering medications: None.  HLD elevated.   Plan:  Dietary changes: Increase soluble fiber, decrease simple carbohydrates, decrease saturated fat. Exercise changes: Moderate to vigorous-intensity aerobic activity 150 minutes per week or as tolerated. We will continue to monitor along with PCP/specialists as it pertains to her weight loss journey.  Lab Results  Component Value Date   CHOL 200 (H) 09/07/2020   HDL 71 09/07/2020   LDLCALC 117 (H) 09/07/2020   TRIG 68 09/07/2020   CHOLHDL 2.8 09/07/2020   Lab Results  Component Value Date   ALT 20 09/07/2020   AST 23 09/07/2020   ALKPHOS 109 09/07/2020   BILITOT 0.6 09/07/2020   The 10-year ASCVD risk score Denman George DC Jr., et al., 2013) is: 3.4%   Values used to calculate the score:     Age: 60 years     Sex: Female     Is Non-Hispanic African American: Yes     Diabetic: No     Tobacco smoker: No     Systolic Blood Pressure: 110 mmHg     Is BP treated: Yes     HDL Cholesterol: 71 mg/dL     Total Cholesterol: 200 mg/dL  5. Major depressive disorder, remission status  unspecified, unspecified whether recurrent Controlled. Medication: None.  She is feeling better. She is going outside each day to walk.  She has spoken with her associate pastor for counseling a couple of times since last office visit.  Plan:  Discussed labs with patient today.  BH and Psychiatry have called her for appointments, but she has not returned the call.  Today, she promises she will call them to schedule within the next week. Behavior modification techniques were discussed today to help deal with emotional/non-hunger eating behaviors.  6. At risk for impaired metabolic function Due to Deryn's current state of health and medical condition(s), she is at a significantly higher risk for impaired metabolic function.   At least 22 minutes was spent on counseling Corianne about these concerns today.  This places the patient at a much greater risk to subsequently develop cardio-pulmonary  conditions that can negatively affect the patient's quality of life.  I stressed the importance of reversing these risks factors.  The initial goal is to lose at least 5-10% of starting weight to help reduce risk factors.  Counseling:  Intensive lifestyle modifications discussed with Elly as the most appropriate first line treatment.  she will continue to work on diet, exercise, and weight loss efforts.  We will continue to reassess these conditions on a fairly regular basis in an attempt to decrease the patient's overall morbidity and mortality.  7. Class 1 obesity with serious comorbidity and body mass index (BMI) of 32.0 to 32.9 in adult, unspecified obesity type  Course: Annayah is currently in the action stage of change. As such, her goal is to continue with weight loss efforts.   Nutrition goals: She has agreed to the Category 2 Plan or journal 1200 calories and 80+ grams of protein per day.   Exercise goals: As is.  Behavioral modification strategies: increasing lean protein intake, decreasing simple  carbohydrates, increasing water intake, meal planning and cooking strategies, keeping healthy foods in the home and planning for success.  Kieran has agreed to follow-up with our clinic in 2-3 weeks. She was informed of the importance of frequent follow-up visits to maximize her success with intensive lifestyle modifications for her multiple health conditions.   Objective:   Blood pressure 110/67, pulse 63, temperature 97.7 F (36.5 C), height 5\' 3"  (1.6 m), weight 183 lb (83 kg), SpO2 96 %. Body mass index is 32.42 kg/m.  General: Cooperative, alert, well developed, in no acute distress. HEENT: Conjunctivae and lids unremarkable. Cardiovascular: Regular rhythm.  Lungs: Normal work of breathing. Neurologic: No focal deficits.   Lab Results  Component Value Date   CREATININE 0.69 09/07/2020   BUN 13 09/07/2020   NA 142 09/07/2020   K 4.5 09/07/2020   CL 101 09/07/2020   CO2 23 09/07/2020   Lab Results  Component Value Date   ALT 20 09/07/2020   AST 23 09/07/2020   ALKPHOS 109 09/07/2020   BILITOT 0.6 09/07/2020   Lab Results  Component Value Date   HGBA1C 5.6 09/07/2020   Lab Results  Component Value Date   INSULIN 8.2 09/07/2020   Lab Results  Component Value Date   TSH 0.801 09/07/2020   Lab Results  Component Value Date   CHOL 200 (H) 09/07/2020   HDL 71 09/07/2020   LDLCALC 117 (H) 09/07/2020   TRIG 68 09/07/2020   CHOLHDL 2.8 09/07/2020   Lab Results  Component Value Date   WBC 9.2 09/07/2020   HGB 12.6 09/07/2020   HCT 40.1 09/07/2020   MCV 89 09/07/2020   PLT 230 09/07/2020   Attestation Statements:   Reviewed by clinician on day of visit: allergies, medications, problem list, medical history, surgical history, family history, social history, and previous encounter notes.  I, 09/09/2020, CMA, am acting as Insurance claims handler for Energy manager, DO.  I have reviewed the above documentation for accuracy and completeness, and I agree with the  above. [employer], D.O.  The 21st Century Cures Act was signed into law in 2016 which includes the topic of electronic health records.  This provides immediate access to information in MyChart.  This includes consultation notes, operative notes, office notes, lab results and pathology reports.  If you have any questions about what you read please let 2017 know at your next visit so we can discuss your concerns and take corrective  action if need be.  We are right here with you.

## 2020-10-26 ENCOUNTER — Encounter (INDEPENDENT_AMBULATORY_CARE_PROVIDER_SITE_OTHER): Payer: Self-pay | Admitting: Family Medicine

## 2020-10-26 ENCOUNTER — Other Ambulatory Visit: Payer: Self-pay

## 2020-10-26 ENCOUNTER — Ambulatory Visit (INDEPENDENT_AMBULATORY_CARE_PROVIDER_SITE_OTHER): Payer: 59 | Admitting: Family Medicine

## 2020-10-26 ENCOUNTER — Other Ambulatory Visit (INDEPENDENT_AMBULATORY_CARE_PROVIDER_SITE_OTHER): Payer: Self-pay | Admitting: Family Medicine

## 2020-10-26 ENCOUNTER — Encounter: Payer: Self-pay | Admitting: Allergy

## 2020-10-26 ENCOUNTER — Ambulatory Visit (INDEPENDENT_AMBULATORY_CARE_PROVIDER_SITE_OTHER): Payer: 59 | Admitting: Allergy

## 2020-10-26 VITALS — BP 98/62 | HR 76 | Temp 98.6°F | Resp 16 | Ht 63.2 in | Wt 184.4 lb

## 2020-10-26 VITALS — BP 116/71 | HR 69 | Temp 98.0°F | Ht 63.0 in | Wt 179.0 lb

## 2020-10-26 DIAGNOSIS — T781XXA Other adverse food reactions, not elsewhere classified, initial encounter: Secondary | ICD-10-CM

## 2020-10-26 DIAGNOSIS — E8881 Metabolic syndrome: Secondary | ICD-10-CM | POA: Diagnosis not present

## 2020-10-26 DIAGNOSIS — E559 Vitamin D deficiency, unspecified: Secondary | ICD-10-CM | POA: Diagnosis not present

## 2020-10-26 DIAGNOSIS — E669 Obesity, unspecified: Secondary | ICD-10-CM | POA: Diagnosis not present

## 2020-10-26 DIAGNOSIS — Z9189 Other specified personal risk factors, not elsewhere classified: Secondary | ICD-10-CM | POA: Diagnosis not present

## 2020-10-26 DIAGNOSIS — Z6833 Body mass index (BMI) 33.0-33.9, adult: Secondary | ICD-10-CM

## 2020-10-26 DIAGNOSIS — J3089 Other allergic rhinitis: Secondary | ICD-10-CM | POA: Diagnosis not present

## 2020-10-26 DIAGNOSIS — F329 Major depressive disorder, single episode, unspecified: Secondary | ICD-10-CM

## 2020-10-26 DIAGNOSIS — H1013 Acute atopic conjunctivitis, bilateral: Secondary | ICD-10-CM | POA: Diagnosis not present

## 2020-10-26 DIAGNOSIS — J4530 Mild persistent asthma, uncomplicated: Secondary | ICD-10-CM | POA: Diagnosis not present

## 2020-10-26 MED ORDER — METFORMIN HCL 500 MG PO TABS: ORAL_TABLET | ORAL | 0 refills | Status: DC

## 2020-10-26 MED ORDER — MONTELUKAST SODIUM 10 MG PO TABS: 10.0000 mg | ORAL_TABLET | Freq: Every day | ORAL | 3 refills | Status: AC

## 2020-10-26 MED ORDER — VITAMIN D (ERGOCALCIFEROL) 1.25 MG (50000 UNIT) PO CAPS: 50000.0000 [IU] | ORAL_CAPSULE | ORAL | 0 refills | Status: DC

## 2020-10-26 MED ORDER — AZELASTINE-FLUTICASONE 137-50 MCG/ACT NA SUSP: 1.0000 | Freq: Every day | NASAL | 3 refills | Status: AC

## 2020-10-26 NOTE — Patient Instructions (Addendum)
-   Environmental allergy testing is positive to grasses, weeds, trees, molds, dust mite, cat, mouse. -Allergen avoidance measures discussed/handouts provided -Continue twice daily dosing of antihistamine either Allegra or Xyzal -Start Singulair 10 mg daily at bedtime.  If you notice any change in mood/behavior/sleep after starting Singulair then stop this medication and let us know.  Symptoms resolve after stopping the medication.   -trial dymista 1 spray each nostril twice a day.  This is a combination nasal spray with Flonase + Astelin (nasal antihistamine).  This helps with both nasal congestion and drainage.   If covered by your insurance Dymista replace your Flonase use.  The Astelin component helps with nasal drainage control.  If this is not covered by insurance then will prescribe the Astelin separately for use alongside your Flonase -For itchy watery eyes recommend use of over-the-counter Pataday 1 drop each eye daily as needed -If medication management is not effective in controlling your symptoms then consider allergy immunotherapy.  This is a 3 to 5-year therapy that helps to retrain your body to be tolerant to the allergens above thus when exposed to have less symptom development and less medication needs.   -have access to albuterol inhaler 2 puffs every 4-6 hours as needed for cough/wheeze/shortness of breath/chest tightness.  May use 15-20 minutes prior to activity.   Monitor frequency of use.   -Singulair as above -If you are not meeting the below goals then would recommend starting a controller based inhaler  Asthma control goals:   Full participation in all desired activities (may need albuterol before activity)  Albuterol use two time or less a week on average (not counting use with activity)  Cough interfering with sleep two time or less a month  Oral steroids no more than once a year  No hospitalizations  -Skin test to banana is negative. -The oral allergy syndrome  (OAS) or pollen-food allergy syndrome (PFAS) is a relatively common form of food allergy, particularly in adults. It typically occurs in people who have pollen allergies when the immune system "sees" proteins on the food that look like proteins on the pollen. This results in the allergy antibody (IgE) binding to the food instead of the pollen. Patients typically report itching and/or mild swelling of the mouth and throat immediately following ingestion of certain uncooked fruits (including nuts) or raw vegetables. Only a very small number of affected individuals experience systemic allergic reactions, such as anaphylaxis which occurs with true food allergies.      Follow-up in 3 months or sooner as needed

## 2020-10-26 NOTE — Progress Notes (Signed)
New Patient Note  RE: Alison Cook MRN: [id number] DOB: [birth year] Date of Office Visit: 10/26/2020  Referring provider: Tally Joe, MD Primary care provider: Tally Joe, MD  Chief Complaint: Allergies  History of present illness: Alison Cook is a 60 y.o. female presenting today for evaluation of allergic rhinitis, possible asthma.  She states in March she started having constant cough, watery eyes, runny nose, chest congestion.   She went to UC twice with these symptoms and states she was diagnosed with pneumonia confirmed on CXR.  She states she was prescribed prednisone and recommended to take mucinex.   She states she felt good for about 2 weeks before symptoms returned.  She went to another UC for these symptoms and was told it was her asthma.  She was treated this time with cough syrup and antibiotic.  She state she felt better.  But now if she goes outside she starts coughing and watery eyes all over again.   She has tried taking claritin or allegra in the morning, xyzal at night. She does feel they helped until her symptoms got worse requiring antibiotics.  She states her eye doctor told her to get an eyedrop OTC but has not gotten it.  She uses flonase 1 spray each nostril as needed which she states used to be helpful.    She has an albuterol inhaler that she is out of now.  She would use the albuterol when she was out walking or coughing at work or sometimes at night with coughing.  She believes she was using it pretty much daily initially.   No history of eczema.  With bananas she reports a scratchy throat.    Review of systems: Review of Systems  Constitutional: Negative.   HENT:       See HPI  Eyes:       See HPI  Respiratory:       See HPI  Cardiovascular: Negative.   Gastrointestinal: Negative.   Musculoskeletal: Negative.   Skin: Negative.   Neurological: Negative.     All other systems negative unless noted above in HPI  Past medical  history: Past Medical History:  Diagnosis Date  . Anxiety   . Asthma   . Back pain   . Chest pain   . Chronic headaches   . Chronic pain of both feet   . Depression   . Difficulty sleeping   . Essential hypertension   . Heartburn   . Hypertension   . Knee pain   . SOBOE (shortness of breath on exertion)   . Stomach ulcer   . Swelling of both lower extremities   . Vitamin D deficiency     Past surgical history: Past Surgical History:  Procedure Laterality Date  . OVARIAN CYST REMOVAL  1998    Family history:  Family History  Problem Relation Age of Onset  . Hypertension Mother   . Cancer Mother   . Hypertension Father   . Heart disease Father   . Cancer Father   . Sleep apnea Father   . Allergic rhinitis Father   . Angioedema Neg Hx   . Asthma Neg Hx   . Atopy Neg Hx   . Eczema Neg Hx   . Immunodeficiency Neg Hx   . Urticaria Neg Hx     Social history: Lives in a home without carpeting with gas heating and central cooling.  No pets in the home.  There is no concern for water  damage or mildew in the home currently.  There was previous concern for telemetry mildew from February 2020.  There is no concern for roaches in the home.  She is Dietitian and works on Production designer, theatre/television/film and phone.  She is exposed to fumes, chemicals and dust in her job at her old building but had carpeting.  She denies a smoking.  Medication List: Current Outpatient Medications  Medication Sig Dispense Refill  . BEE POLLEN PO Take by mouth.    . Cholecalciferol (VITAMIN D3) 10 MCG (400 UNIT) CAPS Take by mouth.    . Fexofenadine HCl (ALLEGRA ALLERGY PO)     . fluticasone (FLONASE) 50 MCG/ACT nasal spray 1-2 sprays    . levocetirizine (XYZAL) 5 MG tablet Take 5 mg by mouth every evening.    . loratadine (CLARITIN) 10 MG tablet Take 10 mg by mouth daily.    . Multiple Vitamin (MULTIVITAMINS PO) See admin instructions.    . naproxen sodium (ALEVE) 220 MG tablet Take 220 mg by  mouth daily as needed.    Marland Kitchen olmesartan (BENICAR) 40 MG tablet Take 40 mg by mouth daily.    Marland Kitchen PROMETHAZINE-DM PO Take by mouth.    Marland Kitchen UNABLE TO FIND Med Name: OTC Diabetic Tussin Align Probiotic Glucocil-Blood sugar Opitmizer CoQ10 Zinc Magnesium    . Vitamin D, Ergocalciferol, (DRISDOL) 1.25 MG (50000 UNIT) CAPS capsule Take 1 capsule (50,000 Units total) by mouth every 7 (seven) days. 4 capsule 0   No current facility-administered medications for this visit.    Known medication allergies: Allergies  Allergen Reactions  . Banana     Throat itches and swells able to eat organic bananas with no issues      Physical examination: Blood pressure 98/62, pulse 76, temperature 98.6 F (37 C), temperature source Temporal, resp. rate 16, height 5' 3.2" (1.605 m), weight 184 lb 6.4 oz (83.6 kg), SpO2 97 %.  General: Alert, interactive, in no acute distress. HEENT: PERRLA, TMs pearly gray, turbinates mildly edematous with clear discharge, post-pharynx non erythematous. Neck: Supple without lymphadenopathy. Lungs: Clear to auscultation without wheezing, rhonchi or rales. {no increased work of breathing. CV: Normal S1, S2 without murmurs. Abdomen: Nondistended, nontender. Skin: Erythematous urticarial lesion on the left inner forearm near the wrist with a central scab.  Several erythematous papules on the right forearm and upper arm. Extremities:  No clubbing, cyanosis or edema. Neuro:   Grossly intact.  Diagnositics/Labs: Spirometry: FEV1: 1.55 L 76%, FVC: 1.95 L 76% predicted.  Status post albuterol she had a 12% increase in FEV1 to 1.74 L or 86% which normalized  Allergy testing: Environmental allergy skin prick testing is positive to grasses, weeds, trees, Cladosporium, Rhizopus, dust mite DP, cat and mouse. Skin prick to banana is negative Allergy testing results were read and interpreted by provider, documented by clinical staff.   Assessment and plan: Allergic rhinitis with  conjunctivitis  - Environmental allergy testing is positive to grasses, weeds, trees, molds, dust mite, cat, mouse. -Allergen avoidance measures discussed/handouts provided -Continue twice daily dosing of antihistamine either Allegra or Xyzal -Start Singulair 10 mg daily at bedtime.  If you notice any change in mood/behavior/sleep after starting Singulair then stop this medication and let us know.  Symptoms resolve after stopping the medication.   -trial dymista 1 spray each nostril twice a day.  This is a combination nasal spray with Flonase + Astelin (nasal antihistamine).  This helps with both nasal congestion and drainage.   If covered  by your insurance Dymista replace your Flonase use.  The Astelin component helps with nasal drainage control.  If this is not covered by insurance then will prescribe the Astelin separately for use alongside your Flonase -For itchy watery eyes recommend use of over-the-counter Pataday 1 drop each eye daily as needed -If medication management is not effective in controlling your symptoms then consider allergy immunotherapy.  This is a 3 to 5-year therapy that helps to retrain your body to be tolerant to the allergens above thus when exposed to have less symptom development and less medication needs.  Mild persistent asthma -have access to albuterol inhaler 2 puffs every 4-6 hours as needed for cough/wheeze/shortness of breath/chest tightness.  May use 15-20 minutes prior to activity.   Monitor frequency of use.   -Singulair as above -If you are not meeting the below goals then would recommend starting a controller based inhaler  Asthma control goals:   Full participation in all desired activities (may need albuterol before activity)  Albuterol use two time or less a week on average (not counting use with activity)  Cough interfering with sleep two time or less a month  Oral steroids no more than once a year  No hospitalizations  Pollen food allergy  syndrome -Skin test to banana is negative. -The oral allergy syndrome (OAS) or pollen-food allergy syndrome (PFAS) is a relatively common form of food allergy, particularly in adults. It typically occurs in people who have pollen allergies when the immune system "sees" proteins on the food that look like proteins on the pollen. This results in the allergy antibody (IgE) binding to the food instead of the pollen. Patients typically report itching and/or mild swelling of the mouth and throat immediately following ingestion of certain uncooked fruits (including nuts) or raw vegetables. Only a very small number of affected individuals experience systemic allergic reactions, such as anaphylaxis which occurs with true food allergies.     Follow-up in 3 months or sooner as needed  I appreciate the opportunity to take part in Merrin's care. Please do not hesitate to contact me with questions.  Sincerely,   Margo Aye, MD Allergy/Immunology [facility]

## 2020-11-01 NOTE — Progress Notes (Signed)
Chief Complaint:   OBESITY Alison Cook is here to discuss her progress with her obesity treatment plan along with follow-up of her obesity related diagnoses.   Today's visit was #: 3 Starting weight: 191 lbs Starting date: 09/07/2020 Today's weight: 179 lbs Today's date: 10/26/2020 Weight change since last visit: 4 lbs Total lbs lost to date: 12 lbs Body mass index is 31.71 kg/m.  Total weight loss percentage to date: -6.28%  Interim History:  Alison Cook is here for a follow up office visit and she is following the meal plan without concerns or issues.  Patient's meal and food recall appears to be accurate and consistent with what is on the plan.  When on plan, her hunger and cravings are well controlled.    Alison Cook endorses sugar cravings starting at 3 pm until bedtime.  Current Meal Plan: the Category 2 Plan or keeping a food journal and adhering to recommended goals of 1200 calories and 80+ grams of protein for 75-80% of the time.  Current Exercise Plan: Walking for 90 minutes 3-4 times per week.  Assessment/Plan:   Medications Discontinued During This Encounter  Medication Reason  . Vitamin D, Ergocalciferol, (DRISDOL) 1.25 MG (50000 UNIT) CAPS capsule Reorder    Meds ordered this encounter  Medications  . Vitamin D, Ergocalciferol, (DRISDOL) 1.25 MG (50000 UNIT) CAPS capsule    Sig: Take 1 capsule (50,000 Units total) by mouth every 7 (seven) days.    Dispense:  4 capsule    Refill:  0  . metFORMIN (GLUCOPHAGE) 500 MG tablet    Sig: 1 po qd with lunch    Dispense:  30 tablet    Refill:  0    1. Insulin resistance Not at goal. Goal is HgbA1c < 5.7, fasting insulin closer to 5.  Medication: None.  Alison Cook endorses sugar cravings starting at 3 pm until bedtime  Plan:  Start metformin 500 mg daily.  Risks/benefits of medication discussed and discussion had.  She desires to take.  She will continue to focus on protein-rich, low simple carbohydrate foods. We reviewed  the importance of hydration, regular exercise for stress reduction, and restorative sleep.   Lab Results  Component Value Date   HGBA1C 5.6 09/07/2020   Lab Results  Component Value Date   INSULIN 8.2 09/07/2020   2. Vitamin D deficiency Not at goal. Current vitamin D is 26.5, tested on 09/07/2020. Optimal goal > 50 ng/dL.  She is taking vitamin D 50,000 IU weekly.  She says she feels better after she takes her vitamin D, especially for 3-4 days, but feels it wears off.  Plan: Continue to take prescription Vitamin D @50 ,000 IU every week as prescribed.  Follow-up for routine testing of Vitamin D, at least 2-3 times per year to avoid over-replacement.  - Refill Vitamin D, Ergocalciferol, (DRISDOL) 1.25 MG (50000 UNIT) CAPS capsule; Take 1 capsule (50,000 Units total) by mouth every 7 (seven) days.  Dispense: 4 capsule; Refill: 0  3. Major depressive disorder, remission status unspecified, unspecified whether recurrent She has been walking for 90 minutes 4-5 days per week.  She has been referred to Psychiatry and Behavioral Health for counseling in the recent past, but has not made an appointment.  Plan:  Strongly recommend she follow-up with her PCP and  Alison Cook and Psychiatry for management.  Continue to exercise daily and follow prudent nutritional plan.  4. At risk for dehydration Alison Cook is at higher than average risk of dehydration.  Alison Cook was given more than 8 minutes of proper hydration counseling today.  We discussed the signs and symptoms of dehydration, some of which may include muscle cramping, constipation or even orthostatic symptoms.  Counseling on the prevention of dehydration was also provided today.  Alison Cook is at risk for dehydration due to weight loss, lifestyle and behavorial habits and possibly due to taking certain medication(s).  She was encouraged to adequately hydrate and monitor fluid status to avoid dehydration as well as weight loss plateaus.  Unless pre-existing renal or  cardiopulmonary conditions exist, in which patient was told to limit their fluid intake, I recommended roughly one half of their weight in pounds to be the approximate ounces of non-caloric, non-caffeinated beverages they should drink per day; including more if they are engaging in exercise.  5. Class 1 obesity with serious comorbidity and body mass index (BMI) of 33.0 to 33.9 in adult, unspecified obesity type  Course: Alison Cook is currently in the action stage of change. As such, her goal is to continue with weight loss efforts.   Nutrition goals: She has agreed to the Category 2 Plan or keeping a food journal and adhering to recommended goals of 1200 calories and 80+ grams of protein.   Exercise goals: As is.  Behavioral modification strategies: increasing water intake, emotional eating strategies and avoiding temptations.  Alison Cook has agreed to follow-up with our clinic in 2-3 weeks. She was informed of the importance of frequent follow-up visits to maximize her success with intensive lifestyle modifications for her multiple health conditions.   Objective:   Blood pressure 116/71, pulse 69, temperature 98 F (36.7 C), height 5\' 3"  (1.6 m), weight 179 lb (81.2 kg), SpO2 98 %. Body mass index is 31.71 kg/m.  General: Cooperative, alert, well developed, in no acute distress. HEENT: Conjunctivae and lids unremarkable. Cardiovascular: Regular rhythm.  Lungs: Normal work of breathing. Neurologic: No focal deficits.   Lab Results  Component Value Date   CREATININE 0.69 09/07/2020   BUN 13 09/07/2020   NA 142 09/07/2020   K 4.5 09/07/2020   CL 101 09/07/2020   CO2 23 09/07/2020   Lab Results  Component Value Date   ALT 20 09/07/2020   AST 23 09/07/2020   ALKPHOS 109 09/07/2020   BILITOT 0.6 09/07/2020   Lab Results  Component Value Date   HGBA1C 5.6 09/07/2020   Lab Results  Component Value Date   INSULIN 8.2 09/07/2020   Lab Results  Component Value Date   TSH 0.801  09/07/2020   Lab Results  Component Value Date   CHOL 200 (H) 09/07/2020   HDL 71 09/07/2020   LDLCALC 117 (H) 09/07/2020   TRIG 68 09/07/2020   CHOLHDL 2.8 09/07/2020   Lab Results  Component Value Date   WBC 9.2 09/07/2020   HGB 12.6 09/07/2020   HCT 40.1 09/07/2020   MCV 89 09/07/2020   PLT 230 09/07/2020   Attestation Statements:   Reviewed by clinician on day of visit: allergies, medications, problem list, medical history, surgical history, family history, social history, and previous encounter notes.  I, 09/09/2020, CMA, am acting as Insurance claims handler for Energy manager, DO.  I have reviewed the above documentation for accuracy and completeness, and I agree with the above. [employer], D.O.  The 21st Century Cures Act was signed into law in 2016 which includes the topic of electronic health records.  This provides immediate access to information in MyChart.  This includes consultation notes,  operative notes, office notes, lab results and pathology reports.  If you have any questions about what you read please let us know at your next visit so we can discuss your concerns and take corrective action if need be.  We are right here with you.

## 2020-11-09 ENCOUNTER — Ambulatory Visit (INDEPENDENT_AMBULATORY_CARE_PROVIDER_SITE_OTHER): Payer: 59 | Admitting: Family Medicine

## 2020-11-13 MED ORDER — METFORMIN HCL 500 MG PO TABS: ORAL_TABLET | ORAL | 0 refills | Status: DC

## 2020-11-20 ENCOUNTER — Other Ambulatory Visit: Payer: Self-pay

## 2020-11-20 ENCOUNTER — Ambulatory Visit (INDEPENDENT_AMBULATORY_CARE_PROVIDER_SITE_OTHER): Payer: 59 | Admitting: Family Medicine

## 2020-11-20 VITALS — BP 101/66 | HR 77 | Temp 98.3°F | Ht 63.0 in | Wt 175.0 lb

## 2020-11-20 DIAGNOSIS — E669 Obesity, unspecified: Secondary | ICD-10-CM | POA: Diagnosis not present

## 2020-11-20 DIAGNOSIS — E559 Vitamin D deficiency, unspecified: Secondary | ICD-10-CM

## 2020-11-20 DIAGNOSIS — E8881 Metabolic syndrome: Secondary | ICD-10-CM | POA: Diagnosis not present

## 2020-11-20 DIAGNOSIS — Z9189 Other specified personal risk factors, not elsewhere classified: Secondary | ICD-10-CM | POA: Diagnosis not present

## 2020-11-20 DIAGNOSIS — F329 Major depressive disorder, single episode, unspecified: Secondary | ICD-10-CM | POA: Diagnosis not present

## 2020-11-20 DIAGNOSIS — Z6833 Body mass index (BMI) 33.0-33.9, adult: Secondary | ICD-10-CM

## 2020-11-20 MED ORDER — VITAMIN D (ERGOCALCIFEROL) 1.25 MG (50000 UNIT) PO CAPS: 50000.0000 [IU] | ORAL_CAPSULE | ORAL | 0 refills | Status: DC

## 2020-11-29 NOTE — Progress Notes (Signed)
Chief Complaint:   OBESITY Alison Cook is here to discuss her progress with her obesity treatment plan along with follow-up of her obesity related diagnoses.   Today's visit was #: 4 Starting weight: 191 lbs Starting date: 09/07/220 Today's weight: 175 lbs Today's date: 11/20/2020 Weight change since last visit: 4 lbs Total lbs lost to date: 16 lbs Body mass index is 31 kg/m.  Total weight loss percentage to date: -8.38%  Interim History:  "I'm doing mediocre."  On Memorial Day she slipped up some and was on steroids and antibiotics for a skin infection.  She has been back on plan for 1-2 weeks now.  She has been unable to eat all of the food.  On Saturday, she exercises for 2-3 hours.  Current Meal Plan: the Category 2 Plan or keeping a food journal and adhering to recommended goals of 1200 calories and 80 grams of protein for 60-65% of the time.  Current Exercise Plan: Walking for 5-6 miles 3-4 times per week.   Assessment/Plan:   Medications Discontinued During This Encounter  Medication Reason   Vitamin D, Ergocalciferol, (DRISDOL) 1.25 MG (50000 UNIT) CAPS capsule Reorder    Meds ordered this encounter  Medications   Vitamin D, Ergocalciferol, (DRISDOL) 1.25 MG (50000 UNIT) CAPS capsule    Sig: Take 1 capsule (50,000 Units total) by mouth every 7 (seven) days.    Dispense:  4 capsule    Refill:  0    1. Insulin resistance Not at goal. Goal is HgbA1c < 5.7, fasting insulin closer to 5.  Medication: metformin 500 mg daily.  Started metformin this morning for the first time.  Tolerating well without side effects today.  Plan:  Continue metformin.  Declines refill.  She will continue to focus on protein-rich, low simple carbohydrate foods. We reviewed the importance of hydration, regular exercise for stress reduction, and restorative sleep.   Lab Results  Component Value Date   HGBA1C 5.6 09/07/2020   Lab Results  Component Value Date   INSULIN 8.2 09/07/2020   2.  Vitamin D deficiency Not at goal. Current vitamin D is 26.5, tested on 09/07/2020. Optimal goal > 50 ng/dL.  She is taking vitamin D 50,000 IU weekly.  Plan: Continue to take prescription Vitamin D @50 ,000 IU every week as prescribed.  Follow-up for routine testing of Vitamin D, at least 2-3 times per year to avoid over-replacement.  - Refill Vitamin D, Ergocalciferol, (DRISDOL) 1.25 MG (50000 UNIT) CAPS capsule; Take 1 capsule (50,000 Units total) by mouth every 7 (seven) days.  Dispense: 4 capsule; Refill: 0  3. Major depressive disorder, remission status unspecified, unspecified whether recurrent Talking with therapist twice weekly online.  Denies SI.  Declines need for medication.  Doing Calm App and increased exercise indoors.  Doing better, she says.  Plan:  Continue with therapist and their therapy modalities.   4. At risk for dehydration Alison Cook is at higher than average risk of dehydration.  Alison Cook was given more than 9 minutes of proper hydration counseling today.  We discussed the signs and symptoms of dehydration, some of which may include muscle cramping, constipation or even orthostatic symptoms.  Counseling on the prevention of dehydration was also provided today.  Alison Cook is at risk for dehydration due to weight loss, lifestyle and behavorial habits and possibly due to taking certain medication(s).  She was encouraged to adequately hydrate and monitor fluid status to avoid dehydration as well as weight loss plateaus.  Unless pre-existing  renal or cardiopulmonary conditions exist, in which patient was told to limit their fluid intake, I recommended roughly one half of their weight in pounds to be the approximate ounces of non-caloric, non-caffeinated beverages they should drink per day; including more if they are engaging in exercise.  5. Class 1 obesity with serious comorbidity and body mass index (BMI) of 33.0 to 33.9 in adult, unspecified obesity type  Course: Alison Cook is currently in  the action stage of change. As such, her goal is to continue with weight loss efforts.   Nutrition goals: She has agreed to the Category 2 Plan or keeping a food journal and adhering to recommended goals of 1200 calories and 80 grams of protein.   Exercise goals:  As is.  Behavioral modification strategies: meal planning and cooking strategies and planning for success.  Alison Cook has agreed to follow-up with our clinic in 2-3 weeks. She was informed of the importance of frequent follow-up visits to maximize her success with intensive lifestyle modifications for her multiple health conditions.    Objective:   Blood pressure 101/66, pulse 77, temperature 98.3 F (36.8 C), height 5\' 3"  (1.6 m), weight 175 lb (79.4 kg), SpO2 94 %. Body mass index is 31 kg/m.  General: Cooperative, alert, well developed, in no acute distress. HEENT: Conjunctivae and lids unremarkable. Cardiovascular: Regular rhythm.  Lungs: Normal work of breathing. Neurologic: No focal deficits.   Lab Results  Component Value Date   CREATININE 0.69 09/07/2020   BUN 13 09/07/2020   NA 142 09/07/2020   K 4.5 09/07/2020   CL 101 09/07/2020   CO2 23 09/07/2020   Lab Results  Component Value Date   ALT 20 09/07/2020   AST 23 09/07/2020   ALKPHOS 109 09/07/2020   BILITOT 0.6 09/07/2020   Lab Results  Component Value Date   HGBA1C 5.6 09/07/2020   Lab Results  Component Value Date   INSULIN 8.2 09/07/2020   Lab Results  Component Value Date   TSH 0.801 09/07/2020   Lab Results  Component Value Date   CHOL 200 (H) 09/07/2020   HDL 71 09/07/2020   LDLCALC 117 (H) 09/07/2020   TRIG 68 09/07/2020   CHOLHDL 2.8 09/07/2020   Lab Results  Component Value Date   WBC 9.2 09/07/2020   HGB 12.6 09/07/2020   HCT 40.1 09/07/2020   MCV 89 09/07/2020   PLT 230 09/07/2020    Attestation Statements:   Reviewed by clinician on day of visit: allergies, medications, problem list, medical history, surgical  history, family history, social history, and previous encounter notes.  I, 09/09/2020, CMA, am acting as Insurance claims handler for Energy manager, DO.  I have reviewed the above documentation for accuracy and completeness, and I agree with the above. [employer], D.O.  The 21st Century Cures Act was signed into law in 2016 which includes the topic of electronic health records.  This provides immediate access to information in MyChart.  This includes consultation notes, operative notes, office notes, lab results and pathology reports.  If you have any questions about what you read please let 2017 know at your next visit so we can discuss your concerns and take corrective action if need be.  We are right here with you.

## 2020-12-05 ENCOUNTER — Encounter (INDEPENDENT_AMBULATORY_CARE_PROVIDER_SITE_OTHER): Payer: Self-pay

## 2020-12-18 ENCOUNTER — Ambulatory Visit (INDEPENDENT_AMBULATORY_CARE_PROVIDER_SITE_OTHER): Payer: 59 | Admitting: Family Medicine

## 2020-12-18 ENCOUNTER — Other Ambulatory Visit: Payer: Self-pay

## 2020-12-18 VITALS — BP 110/69 | HR 72 | Temp 98.4°F | Ht 63.0 in | Wt 172.0 lb

## 2020-12-18 DIAGNOSIS — Z9189 Other specified personal risk factors, not elsewhere classified: Secondary | ICD-10-CM

## 2020-12-18 DIAGNOSIS — E8881 Metabolic syndrome: Secondary | ICD-10-CM | POA: Diagnosis not present

## 2020-12-18 DIAGNOSIS — E559 Vitamin D deficiency, unspecified: Secondary | ICD-10-CM | POA: Diagnosis not present

## 2020-12-18 DIAGNOSIS — Z6833 Body mass index (BMI) 33.0-33.9, adult: Secondary | ICD-10-CM | POA: Diagnosis not present

## 2020-12-18 DIAGNOSIS — E669 Obesity, unspecified: Secondary | ICD-10-CM | POA: Diagnosis not present

## 2020-12-18 MED ORDER — METFORMIN HCL 500 MG PO TABS: ORAL_TABLET | ORAL | 0 refills | Status: DC

## 2020-12-18 MED ORDER — VITAMIN D (ERGOCALCIFEROL) 1.25 MG (50000 UNIT) PO CAPS: 50000.0000 [IU] | ORAL_CAPSULE | ORAL | 0 refills | Status: DC

## 2021-01-01 NOTE — Progress Notes (Signed)
Chief Complaint:   OBESITY Rorey is here to discuss her progress with her obesity treatment plan along with follow-up of her obesity related diagnoses.   Today's visit was #: 5 Starting weight: 191 lbs Starting date: 09/07/2020 Today's weight: 175 lbs Today's date: 12/18/2020 Weight change since last visit: 0 Total lbs lost to date: 16 lbs Body mass index is 30.47 kg/m.  Total weight loss percentage to date: -8.38%  Interim History:  Tawny occasionally skips a meal.  Occasional eats out (2 times per month).  Snacks on canteloupe, pineapple, frozen yogurt bars.  Current Meal Plan: the Category 2 Plan and keeping a food journal and adhering to recommended goals of 1200 calories and 80 grams of protein for 80% of the time.  Current Exercise Plan: As is.  Assessment/Plan:   Orders Placed This Encounter  Procedures   VITAMIN D 25 Hydroxy (Vit-D Deficiency, Fractures)   Medications Discontinued During This Encounter  Medication Reason   metFORMIN (GLUCOPHAGE) 500 MG tablet Reorder   Vitamin D, Ergocalciferol, (DRISDOL) 1.25 MG (50000 UNIT) CAPS capsule Reorder    Meds ordered this encounter  Medications   metFORMIN (GLUCOPHAGE) 500 MG tablet    Sig: 1 po qd with lunch    Dispense:  30 tablet    Refill:  0    No RF until OV   Vitamin D, Ergocalciferol, (DRISDOL) 1.25 MG (50000 UNIT) CAPS capsule    Sig: Take 1 capsule (50,000 Units total) by mouth every 7 (seven) days.    Dispense:  4 capsule    Refill:  0    No RF until OV    1. Insulin resistance At goal. Goal is HgbA1c < 5.7, fasting insulin closer to 5.  Medication: metformin 500 mg daily with lunch.  Metformin helps a lot with decreasing hunger and carb cravings.  Plan:  She will continue to focus on protein-rich, low simple carbohydrate foods. We reviewed the importance of hydration, regular exercise for stress reduction, and restorative sleep.   Lab Results  Component Value Date   HGBA1C 5.6 09/07/2020    Lab Results  Component Value Date   INSULIN 8.2 09/07/2020   - Refill metFORMIN (GLUCOPHAGE) 500 MG tablet; 1 po qd with lunch  Dispense: 30 tablet; Refill: 0  2. Vitamin D deficiency Not at goal.  She is taking vitamin D 50,000 IU weekly.  Plan: Continue to take prescription Vitamin D @50 ,000 IU every week as prescribed.  Will check vitamin D level at next office visit.  Lab Results  Component Value Date   VD25OH 26.5 (L) 09/07/2020   - Refill Vitamin D, Ergocalciferol, (DRISDOL) 1.25 MG (50000 UNIT) CAPS capsule; Take 1 capsule (50,000 Units total) by mouth every 7 (seven) days.  Dispense: 4 capsule; Refill: 0 - VITAMIN D 25 Hydroxy (Vit-D Deficiency, Fractures)  3. At risk for deficient intake of food Kyelle was given extensive education and counseling today of more than 8 minutes on risks associated with deficient food intake.  Counseled her on the importance of following our prescribed meal plan and eating adequate amounts of protein.  Discussed with Porfirio Mylar that inadequate food intake over longer periods of time can slow their metabolism down significantly.   4. Class 1 obesity with serious comorbidity and body mass index (BMI) of 33.0 to 33.9 in adult, unspecified obesity type  Course: Demia is currently in the action stage of change. As such, her goal is to continue with weight loss efforts.  Nutrition goals: She has agreed to the Category 2 Plan and keeping a food journal and adhering to recommended goals of 1200 calories and 80 grams of protein.  Add a protein shake on hard work out days.   Exercise goals:  As is.  Behavioral modification strategies: increasing lean protein intake and planning for success.  Zoraida has agreed to follow-up with our clinic in 2-3 weeks. She was informed of the importance of frequent follow-up visits to maximize her success with intensive lifestyle modifications for her multiple health conditions.   Objective:   Blood pressure  110/69, pulse 72, temperature 98.4 F (36.9 C), height 5\' 3"  (1.6 m), weight 172 lb (78 kg), SpO2 98 %. Body mass index is 30.47 kg/m.  General: Cooperative, alert, well developed, in no acute distress. HEENT: Conjunctivae and lids unremarkable. Cardiovascular: Regular rhythm.  Lungs: Normal work of breathing. Neurologic: No focal deficits.   Lab Results  Component Value Date   CREATININE 0.69 09/07/2020   BUN 13 09/07/2020   NA 142 09/07/2020   K 4.5 09/07/2020   CL 101 09/07/2020   CO2 23 09/07/2020   Lab Results  Component Value Date   ALT 20 09/07/2020   AST 23 09/07/2020   ALKPHOS 109 09/07/2020   BILITOT 0.6 09/07/2020   Lab Results  Component Value Date   HGBA1C 5.6 09/07/2020   Lab Results  Component Value Date   INSULIN 8.2 09/07/2020   Lab Results  Component Value Date   TSH 0.801 09/07/2020   Lab Results  Component Value Date   CHOL 200 (H) 09/07/2020   HDL 71 09/07/2020   LDLCALC 117 (H) 09/07/2020   TRIG 68 09/07/2020   CHOLHDL 2.8 09/07/2020   Lab Results  Component Value Date   VD25OH 26.5 (L) 09/07/2020   Lab Results  Component Value Date   WBC 9.2 09/07/2020   HGB 12.6 09/07/2020   HCT 40.1 09/07/2020   MCV 89 09/07/2020   PLT 230 09/07/2020   Attestation Statements:   Reviewed by clinician on day of visit: allergies, medications, problem list, medical history, surgical history, family history, social history, and previous encounter notes.  I, 09/09/2020, CMA, am acting as Insurance claims handler for Energy manager, DO.  I have reviewed the above documentation for accuracy and completeness, and I agree with the above. [employer], D.O.  The 21st Century Cures Act was signed into law in 2016 which includes the topic of electronic health records.  This provides immediate access to information in MyChart.  This includes consultation notes, operative notes, office notes, lab results and pathology reports.  If you have any questions  about what you read please let 2017 know at your next visit so we can discuss your concerns and take corrective action if need be.  We are right here with you.

## 2021-01-08 ENCOUNTER — Ambulatory Visit (INDEPENDENT_AMBULATORY_CARE_PROVIDER_SITE_OTHER): Payer: 59 | Admitting: Family Medicine

## 2021-01-08 ENCOUNTER — Encounter (INDEPENDENT_AMBULATORY_CARE_PROVIDER_SITE_OTHER): Payer: Self-pay | Admitting: Family Medicine

## 2021-01-08 ENCOUNTER — Other Ambulatory Visit: Payer: Self-pay

## 2021-01-08 VITALS — BP 142/85 | HR 65 | Temp 98.2°F | Ht 63.0 in | Wt 172.0 lb

## 2021-01-08 DIAGNOSIS — Z9189 Other specified personal risk factors, not elsewhere classified: Secondary | ICD-10-CM

## 2021-01-08 DIAGNOSIS — E8881 Metabolic syndrome: Secondary | ICD-10-CM

## 2021-01-08 DIAGNOSIS — Z6833 Body mass index (BMI) 33.0-33.9, adult: Secondary | ICD-10-CM

## 2021-01-08 DIAGNOSIS — E669 Obesity, unspecified: Secondary | ICD-10-CM

## 2021-01-08 DIAGNOSIS — E559 Vitamin D deficiency, unspecified: Secondary | ICD-10-CM | POA: Diagnosis not present

## 2021-01-08 MED ORDER — METFORMIN HCL 500 MG PO TABS: ORAL_TABLET | ORAL | 0 refills | Status: DC

## 2021-01-08 MED ORDER — VITAMIN D (ERGOCALCIFEROL) 1.25 MG (50000 UNIT) PO CAPS: 50000.0000 [IU] | ORAL_CAPSULE | ORAL | 0 refills | Status: DC

## 2021-01-11 NOTE — Progress Notes (Signed)
Chief Complaint:   OBESITY Alison Cook is here to discuss her progress with her obesity treatment plan along with follow-up of her obesity related diagnoses. Alison Cook is on the Category 2 Plan and keeping a food journal and adhering to recommended goals of 1200 calories and 80 grams of protein Protein Shakes on workout days and states she is following her eating plan approximately 80% of the time. Alison Cook states she is doing cardio for 60 minutes 5 times per week and yoga for 90 minutes 6 times per week.  Today's visit was #: 6 Starting weight: 191 lbs Starting date: 09/07/2020 Today's weight: 172 lbs Today's date:01/08/2021 Total lbs lost to date: 19 lbs Total lbs lost since last in-office visit: 0  Interim History: Alison Cook journaling has helped. She is eating too much "starchy veggies" and too many fruits. Over in calories and under in protein grams most days.  Subjective:   1. Insulin resistance Alison Cook has a diagnosis of insulin resistance based on her elevated fasting insulin level >5. She continues to work on diet and exercise to decrease her risk of diabetes. Alison Cook is taking Metformin.  2. Vitamin D deficiency She is currently taking prescription vitamin D 50,000 IU each week. She denies nausea, vomiting or muscle weakness.  3. At risk for dehydration Alison Cook is at risk for dehydration due to poor po intake of water.  Assessment/Plan:  No orders of the defined types were placed in this encounter.   Medications Discontinued During This Encounter  Medication Reason   metFORMIN (GLUCOPHAGE) 500 MG tablet Reorder   Vitamin D, Ergocalciferol, (DRISDOL) 1.25 MG (50000 UNIT) CAPS capsule Reorder     Meds ordered this encounter  Medications   metFORMIN (GLUCOPHAGE) 500 MG tablet    Sig: 1 po qd with lunch    Dispense:  30 tablet    Refill:  0    No RF until OV   Vitamin D, Ergocalciferol, (DRISDOL) 1.25 MG (50000 UNIT) CAPS capsule    Sig: Take 1 capsule (50,000 Units total) by  mouth every 7 (seven) days.    Dispense:  4 capsule    Refill:  0    No RF until OV     1. Insulin resistance Alison Cook will continue to work on weight loss, exercise, and decreasing simple carbohydrates to help decrease the risk of diabetes. We will refill Metformin 500 mg today. Alison Cook agreed to follow-up with [place] as directed to closely monitor her progress.  - metFORMIN (GLUCOPHAGE) 500 MG tablet; 1 po qd with lunch  Dispense: 30 tablet; Refill: 0  2. Vitamin D deficiency Low Vitamin D level contributes to fatigue and are associated with obesity, breast, and colon cancer. We will refill prescription Vitamin D 50,000 IU every week and Alison Cook will follow-up for routine testing of Vitamin D, at least 2-3 times per year to avoid over-replacement.  - Vitamin D, Ergocalciferol, (DRISDOL) 1.25 MG (50000 UNIT) CAPS capsule; Take 1 capsule (50,000 Units total) by mouth every 7 (seven) days.  Dispense: 4 capsule; Refill: 0  3. At risk for dehydration Alison Cook was given approximately 9 minutes dehydration prevention counseling today. Alison Cook is at risk for dehydration due to weight loss and current medication(s). She was encouraged to hydrate and monitor fluid status to avoid dehydration as well as weight loss plateaus.    4. Obesity with current BMI of 30.5 Alison Cook is currently in the action stage of change. As such, her goal is to continue with weight loss efforts. She  has agreed to keeping a food journal and adhering to recommended goals of 1100-1200 calories and 80+ grams of protein daily.  Exercise goals:  As is.  Behavioral modification strategies: increasing lean protein intake, decreasing simple carbohydrates, and keeping a strict food journal.  Alison Cook has agreed to follow-up with our clinic in 2-3 weeks. She was informed of the importance of frequent follow-up visits to maximize her success with intensive lifestyle modifications for her multiple health conditions.   Objective:   Blood pressure  (!) 142/85, pulse 65, temperature 98.2 F (36.8 C), height 5\' 3"  (1.6 m), weight 172 lb (78 kg), SpO2 100 %. Body mass index is 30.47 kg/m.  General: Cooperative, alert, well developed, in no acute distress. HEENT: Conjunctivae and lids unremarkable. Cardiovascular: Regular rhythm.  Lungs: Normal work of breathing. Neurologic: No focal deficits.   Lab Results  Component Value Date   CREATININE 0.69 09/07/2020   BUN 13 09/07/2020   NA 142 09/07/2020   K 4.5 09/07/2020   CL 101 09/07/2020   CO2 23 09/07/2020   Lab Results  Component Value Date   ALT 20 09/07/2020   AST 23 09/07/2020   ALKPHOS 109 09/07/2020   BILITOT 0.6 09/07/2020   Lab Results  Component Value Date   HGBA1C 5.6 09/07/2020   Lab Results  Component Value Date   INSULIN 8.2 09/07/2020   Lab Results  Component Value Date   TSH 0.801 09/07/2020   Lab Results  Component Value Date   CHOL 200 (H) 09/07/2020   HDL 71 09/07/2020   LDLCALC 117 (H) 09/07/2020   TRIG 68 09/07/2020   CHOLHDL 2.8 09/07/2020   Lab Results  Component Value Date   VD25OH 26.5 (L) 09/07/2020   Lab Results  Component Value Date   WBC 9.2 09/07/2020   HGB 12.6 09/07/2020   HCT 40.1 09/07/2020   MCV 89 09/07/2020   PLT 230 09/07/2020   No results found for: IRON, TIBC, FERRITIN  Attestation Statements:   Reviewed by clinician on day of visit: allergies, medications, problem list, medical history, surgical history, family history, social history, and previous encounter notes.  I, 09/09/2020, RMA, am acting as [employer] for Energy manager, DO.  I have reviewed the above documentation for accuracy and completeness, and I agree with the above. [employer], D.O.  The 21st Century Cures Act was signed into law in 2016 which includes the topic of electronic health records.  This provides immediate access to information in MyChart.  This includes consultation notes, operative notes, office notes, lab results  and pathology reports.  If you have any questions about what you read please let 2017 know at your next visit so we can discuss your concerns and take corrective action if need be.  We are right here with you.

## 2021-01-26 ENCOUNTER — Ambulatory Visit: Payer: 59 | Admitting: Allergy

## 2021-01-26 DIAGNOSIS — J309 Allergic rhinitis, unspecified: Secondary | ICD-10-CM

## 2021-01-29 ENCOUNTER — Other Ambulatory Visit: Payer: Self-pay

## 2021-01-29 ENCOUNTER — Ambulatory Visit (INDEPENDENT_AMBULATORY_CARE_PROVIDER_SITE_OTHER): Payer: 59 | Admitting: Family Medicine

## 2021-01-29 ENCOUNTER — Encounter (INDEPENDENT_AMBULATORY_CARE_PROVIDER_SITE_OTHER): Payer: Self-pay | Admitting: Family Medicine

## 2021-01-29 VITALS — BP 136/86 | HR 60 | Temp 98.4°F | Ht 63.0 in | Wt 171.0 lb

## 2021-01-29 DIAGNOSIS — E559 Vitamin D deficiency, unspecified: Secondary | ICD-10-CM | POA: Diagnosis not present

## 2021-01-29 DIAGNOSIS — E8881 Metabolic syndrome: Secondary | ICD-10-CM | POA: Diagnosis not present

## 2021-01-29 DIAGNOSIS — Z6830 Body mass index (BMI) 30.0-30.9, adult: Secondary | ICD-10-CM

## 2021-01-29 DIAGNOSIS — Z9189 Other specified personal risk factors, not elsewhere classified: Secondary | ICD-10-CM

## 2021-01-29 DIAGNOSIS — I10 Essential (primary) hypertension: Secondary | ICD-10-CM

## 2021-01-29 DIAGNOSIS — E669 Obesity, unspecified: Secondary | ICD-10-CM

## 2021-01-29 MED ORDER — VITAMIN D (ERGOCALCIFEROL) 1.25 MG (50000 UNIT) PO CAPS: 50000.0000 [IU] | ORAL_CAPSULE | ORAL | 0 refills | Status: DC
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
## 2021-03-01 LAB — BASIC METABOLIC PANEL
BUN: 21 (ref 4–21)
CO2: 29 — AB (ref 13–22)
Chloride: 102 (ref 99–108)
Creatinine: 0.8 (ref <=1.1)
Glucose: 74
Potassium: 4 (ref 3.4–5.3)
Sodium: 139 (ref 137–147)

## 2021-03-01 LAB — LIPID PANEL
Cholesterol: 191 (ref 0–200)
HDL: 65 (ref 35–70)
LDL Cholesterol: 117
LDl/HDL Ratio: 2.9
Triglycerides: 47 (ref 40–160)

## 2021-03-01 LAB — COMPREHENSIVE METABOLIC PANEL
Albumin: 4.2 (ref 3.5–5.0)
Calcium: 9.4 (ref 8.7–10.7)
GFR calc Af Amer: 88

## 2021-03-01 LAB — HEPATIC FUNCTION PANEL
ALT: 18 (ref 7–35)
AST: 23 (ref 13–35)
Bilirubin, Total: 0.9

## 2021-03-05 ENCOUNTER — Ambulatory Visit (INDEPENDENT_AMBULATORY_CARE_PROVIDER_SITE_OTHER): Payer: 59 | Admitting: Family Medicine

## 2021-03-12 ENCOUNTER — Encounter (INDEPENDENT_AMBULATORY_CARE_PROVIDER_SITE_OTHER): Payer: Self-pay | Admitting: Family Medicine

## 2021-03-12 ENCOUNTER — Other Ambulatory Visit: Payer: Self-pay

## 2021-03-12 ENCOUNTER — Ambulatory Visit (INDEPENDENT_AMBULATORY_CARE_PROVIDER_SITE_OTHER): Payer: 59 | Admitting: Family Medicine

## 2021-03-12 VITALS — BP 125/72 | HR 65 | Temp 98.1°F | Ht 63.0 in | Wt 170.0 lb

## 2021-03-12 DIAGNOSIS — I10 Essential (primary) hypertension: Secondary | ICD-10-CM

## 2021-03-12 DIAGNOSIS — E669 Obesity, unspecified: Secondary | ICD-10-CM

## 2021-03-12 DIAGNOSIS — Z9189 Other specified personal risk factors, not elsewhere classified: Secondary | ICD-10-CM | POA: Diagnosis not present

## 2021-03-12 DIAGNOSIS — E559 Vitamin D deficiency, unspecified: Secondary | ICD-10-CM | POA: Diagnosis not present

## 2021-03-12 DIAGNOSIS — Z6833 Body mass index (BMI) 33.0-33.9, adult: Secondary | ICD-10-CM

## 2021-03-12 DIAGNOSIS — F39 Unspecified mood [affective] disorder: Secondary | ICD-10-CM | POA: Diagnosis not present

## 2021-03-12 MED ORDER — VITAMIN D (ERGOCALCIFEROL) 1.25 MG (50000 UNIT) PO CAPS: 50000.0000 [IU] | ORAL_CAPSULE | ORAL | 0 refills | Status: DC

## 2021-03-13 NOTE — Progress Notes (Signed)
Chief Complaint:   OBESITY Alison Cook is here to discuss her progress with her obesity treatment plan along with follow-up of her obesity related diagnoses. Alison Cook is on keeping a food journal and adhering to recommended goals of 1100-1200 calories and 80+ grams protein and states she is following her eating plan approximately 60% of the time. Alison Cook states she is doing yoga, walking, biking, and cardio 60 minutes 4-5 times per week.  Today's visit was #: 8 Starting weight: 191 lbs Starting date: 09/07/2020 Today's weight: 170 lbs Today's date: 03/12/2021 Total lbs lost to date: 21 Total lbs lost since last in-office visit: 1  Interim History: Aurore skips lunch on the weekends. She forgot her food journaling log. Pt was journaling everyday until 2 weeks ago when ads increased on the APP. She forgot her BP log as well.  Subjective:   1. Essential hypertension BP at goal. Alison Cook's BP outside of the office runs 112-116/70-80. Medication: Benicar.  2. Mood disorder (HCC), with emotional eating Mood stable, per pt. She still has no counselor and is not emotionally eating. Pt does yoga for 1 hour everyday.  3. Vitamin D deficiency She is currently taking prescription vitamin D 50,000 IU each week. She denies nausea, vomiting or muscle weakness.  4. At risk for depression Alison Cook is at elevated risk of depression due to one or more of the following: family history, significant life stressors, medical conditions and/or poor nutrition.    Assessment/Plan:  No orders of the defined types were placed in this encounter.   Medications Discontinued During This Encounter  Medication Reason   Vitamin D, Ergocalciferol, (DRISDOL) 1.25 MG (50000 UNIT) CAPS capsule Reorder     Meds ordered this encounter  Medications   Vitamin D, Ergocalciferol, (DRISDOL) 1.25 MG (50000 UNIT) CAPS capsule    Sig: Take 1 capsule (50,000 Units total) by mouth every 7 (seven) days.    Dispense:  4 capsule     Refill:  0    30 d supply;  ** OV for RF **   Do not send RF request     1. Essential hypertension Alison Cook is working on healthy weight loss and exercise to improve blood pressure control. We will watch for signs of hypotension as she continues her lifestyle modifications. Continue Benicar and bring lab results to next OV.  2. Mood disorder (HCC), with emotional eating Behavior modification techniques were discussed today to help Alison Cook deal with her emotional/non-hunger eating behaviors.  Orders and follow up as documented in patient record. Obtain a Veterinary surgeon and continue exercise. We will closely monitor.  3. Vitamin D deficiency Low Vitamin D level contributes to fatigue and are associated with obesity, breast, and colon cancer. She agrees to continue to take prescription Vitamin D 50,000 IU every week and will follow-up for routine testing of Vitamin D, at least 2-3 times per year to avoid over-replacement. Bring in recent lab results with Vit D level.  Refill- Vitamin D, Ergocalciferol, (DRISDOL) 1.25 MG (50000 UNIT) CAPS capsule; Take 1 capsule (50,000 Units total) by mouth every 7 (seven) days.  Dispense: 4 capsule; Refill: 0  4. At risk for depression Alison Cook was given approximately 9 minutes of depression risk counseling today. She has risk factors for depression. We discussed the importance of a healthy work life balance, a healthy relationship with food and a good support system. Repetitive spaced learning was employed today to elicit superior memory formation and behavioral change.  5. Obesity with current BMI of  30.1  Alison Cook is currently in the action stage of change. As such, her goal is to continue with weight loss efforts. She has agreed to keeping a food journal and adhering to recommended goals of 1100-1200 calories and 80+ grams protein.   Bring hard copy of labs from recent PCP visit.   - Also bring food journal and BP log of every other day readings.  Exercise goals:   As is  Behavioral modification strategies: increasing lean protein intake and decreasing simple carbohydrates.  Alison Cook has agreed to follow-up with our clinic in 3 weeks. She was informed of the importance of frequent follow-up visits to maximize her success with intensive lifestyle modifications for her multiple health conditions.   Objective:   Blood pressure 125/72, pulse 65, temperature 98.1 F (36.7 C), height 5\' 3"  (1.6 m), weight 170 lb (77.1 kg), SpO2 98 %. Body mass index is 30.11 kg/m.  General: Cooperative, alert, well developed, in no acute distress. HEENT: Conjunctivae and lids unremarkable. Cardiovascular: Regular rhythm.  Lungs: Normal work of breathing. Neurologic: No focal deficits.   Lab Results  Component Value Date   CREATININE 0.69 09/07/2020   BUN 13 09/07/2020   NA 142 09/07/2020   K 4.5 09/07/2020   CL 101 09/07/2020   CO2 23 09/07/2020   Lab Results  Component Value Date   ALT 20 09/07/2020   AST 23 09/07/2020   ALKPHOS 109 09/07/2020   BILITOT 0.6 09/07/2020   Lab Results  Component Value Date   HGBA1C 5.6 09/07/2020   Lab Results  Component Value Date   INSULIN 8.2 09/07/2020   Lab Results  Component Value Date   TSH 0.801 09/07/2020   Lab Results  Component Value Date   CHOL 200 (H) 09/07/2020   HDL 71 09/07/2020   LDLCALC 117 (H) 09/07/2020   TRIG 68 09/07/2020   CHOLHDL 2.8 09/07/2020   Lab Results  Component Value Date   VD25OH 26.5 (L) 09/07/2020   Lab Results  Component Value Date   WBC 9.2 09/07/2020   HGB 12.6 09/07/2020   HCT 40.1 09/07/2020   MCV 89 09/07/2020   PLT 230 09/07/2020    Attestation Statements:   Reviewed by clinician on day of visit: allergies, medications, problem list, medical history, surgical history, family history, social history, and previous encounter notes.  09/09/2020, CMA, am acting as transcriptionist for Edmund Hilda, DO.  I have reviewed the above documentation for  accuracy and completeness, and I agree with the above. [employer], D.O.  The 21st Century Cures Act was signed into law in 2016 which includes the topic of electronic health records.  This provides immediate access to information in MyChart.  This includes consultation notes, operative notes, office notes, lab results and pathology reports.  If you have any questions about what you read please let 2017 know at your next visit so we can discuss your concerns and take corrective action if need be.  We are right here with you.

## 2021-03-15 ENCOUNTER — Encounter (INDEPENDENT_AMBULATORY_CARE_PROVIDER_SITE_OTHER): Payer: Self-pay

## 2021-03-26 LAB — VITAMIN D 25 HYDROXY (VIT D DEFICIENCY, FRACTURES): Vit D, 25-Hydroxy: 52.2

## 2021-03-28 ENCOUNTER — Encounter (INDEPENDENT_AMBULATORY_CARE_PROVIDER_SITE_OTHER): Payer: Self-pay

## 2021-04-09 ENCOUNTER — Encounter (INDEPENDENT_AMBULATORY_CARE_PROVIDER_SITE_OTHER): Payer: Self-pay | Admitting: Family Medicine

## 2021-04-09 ENCOUNTER — Ambulatory Visit (INDEPENDENT_AMBULATORY_CARE_PROVIDER_SITE_OTHER): Payer: 59 | Admitting: Family Medicine

## 2021-04-09 ENCOUNTER — Other Ambulatory Visit: Payer: Self-pay

## 2021-04-09 VITALS — BP 125/78 | HR 63 | Temp 98.1°F | Ht 63.0 in | Wt 171.0 lb

## 2021-04-09 DIAGNOSIS — E559 Vitamin D deficiency, unspecified: Secondary | ICD-10-CM | POA: Diagnosis not present

## 2021-04-09 DIAGNOSIS — Z9189 Other specified personal risk factors, not elsewhere classified: Secondary | ICD-10-CM

## 2021-04-09 DIAGNOSIS — E669 Obesity, unspecified: Secondary | ICD-10-CM

## 2021-04-09 DIAGNOSIS — I10 Essential (primary) hypertension: Secondary | ICD-10-CM

## 2021-04-09 DIAGNOSIS — J302 Other seasonal allergic rhinitis: Secondary | ICD-10-CM

## 2021-04-09 DIAGNOSIS — Z6833 Body mass index (BMI) 33.0-33.9, adult: Secondary | ICD-10-CM

## 2021-04-09 MED ORDER — VITAMIN D (ERGOCALCIFEROL) 1.25 MG (50000 UNIT) PO CAPS: 50000.0000 [IU] | ORAL_CAPSULE | ORAL | 0 refills | Status: DC

## 2021-04-09 NOTE — Progress Notes (Signed)
Chief Complaint:   OBESITY Alison Cook is here to discuss her progress with her obesity treatment plan along with follow-up of her obesity related diagnoses. Alison Cook is on keeping a food journal and adhering to recommended goals of 1100-1200 calories and 80+ grams of protein daily and states she is following her eating plan approximately 75-80% of the time. Alison Cook states she is doing cardio, strength, and yoga for 60 minutes 4-5 times per week.  Today's visit was #: 9 Starting weight: 191 lbs Starting date: 09/07/2020 Today's weight: 171 lbs Today's date: 04/09/2021 Total lbs lost to date: 20 Total lbs lost since last in-office visit: 0  Interim History: Alison Cook notes on the weekends she doesn't log for 3 days per week because she eats the whole time. She skips meals (breakfast q daily and occasional snacks). Per the patient she doesn't eat enough protein. She forgot to write down her grams of protein per day, but she will do so at her next office visit.   Subjective:   1. Essential hypertension Alison Cook's blood pressure is controlled, and she is asymptomatic and has no concerns. Cardiovascular ROS: no chest pain or dyspnea on exertion.  BP Readings from Last 3 Encounters:  04/09/21 125/78  03/12/21 125/72  01/29/21 136/86   2. Seasonal allergies Alison Cook's symptoms at st goal and are well controlled. She is not needing medications currently.  3. Vitamin D deficiency Alison Cook is currently taking prescription vitamin D 50,000 IU each week. She denies nausea, vomiting or muscle weakness.  4. At risk for malnutrition Alison Cook is at increased risk for malnutrition due to inadequate intake of nutrients.   Assessment/Plan:  No orders of the defined types were placed in this encounter.   Medications Discontinued During This Encounter  Medication Reason   Vitamin D, Ergocalciferol, (DRISDOL) 1.25 MG (50000 UNIT) CAPS capsule Reorder     Meds ordered this encounter  Medications   Vitamin  D, Ergocalciferol, (DRISDOL) 1.25 MG (50000 UNIT) CAPS capsule    Sig: Take 1 capsule (50,000 Units total) by mouth every 7 (seven) days.    Dispense:  4 capsule    Refill:  0    30 d supply;  ** OV for RF **   Do not send RF request     1. Essential hypertension Carnesha will continue her medications, and will continue working on healthy weight loss and exercise to improve blood pressure control. We will watch for signs of hypotension as she continues her lifestyle modifications.  2. Seasonal allergies Danell will continue her current treatment plan.   3. Vitamin D deficiency Low Vitamin D level contributes to fatigue and are associated with obesity, breast, and colon cancer. We will refill prescription Vitamin D for 1 month. [place]ima will follow-up for routine testing of Vitamin D, at least 2-3 times per year to avoid over-replacement.  - Vitamin D, Ergocalciferol, (DRISDOL) 1.25 MG (50000 UNIT) CAPS capsule; Take 1 capsule (50,000 Units total) by mouth every 7 (seven) days.  Dispense: 4 capsule; Refill: 0  4. At risk for malnutrition Aidee was given approximately 15 minutes of counseling today regarding prevention of malnutrition and ways to meet macronutrient goals.   5. Obesity with current BMI of 30.4 Alison Cook is currently in the action stage of change. As such, her goal is to continue with weight loss efforts. She has agreed to keeping a food journal and adhering to recommended goals of 1100-1200 calories and 85+ grams of protein daily.   I gave the  patient new logs to fill out and bring with her to her next office visit. If she unable to follow her journaling plan and will consider changing to the Vegetarian plan.   Exercise goals: As is.  Behavioral modification strategies: decreasing eating out and no skipping meals.  Alison Cook has agreed to follow-up with our clinic in 2 to 3 weeks. She was informed of the importance of frequent follow-up visits to maximize her success with intensive  lifestyle modifications for her multiple health conditions.   Objective:   Blood pressure 125/78, pulse 63, temperature 98.1 F (36.7 C), height 5\' 3"  (1.6 m), weight 171 lb (77.6 kg), SpO2 99 %. Body mass index is 30.29 kg/m.  General: Cooperative, alert, well developed, in no acute distress. HEENT: Conjunctivae and lids unremarkable. Cardiovascular: Regular rhythm.  Lungs: Normal work of breathing. Neurologic: No focal deficits.   Lab Results  Component Value Date   CREATININE 0.8 03/01/2021   BUN 21 03/01/2021   NA 139 03/01/2021   K 4.0 03/01/2021   CL 102 03/01/2021   CO2 29 (A) 03/01/2021   Lab Results  Component Value Date   ALT 18 03/01/2021   AST 23 03/01/2021   ALKPHOS 109 09/07/2020   BILITOT 0.6 09/07/2020   Lab Results  Component Value Date   HGBA1C 5.6 09/07/2020   Lab Results  Component Value Date   INSULIN 8.2 09/07/2020   Lab Results  Component Value Date   TSH 0.801 09/07/2020   Lab Results  Component Value Date   CHOL 191 03/01/2021   HDL 65 03/01/2021   LDLCALC 117 03/01/2021   TRIG 47 03/01/2021   CHOLHDL 2.8 09/07/2020   Lab Results  Component Value Date   VD25OH 52.2 03/26/2021   VD25OH 26.5 (L) 09/07/2020   Lab Results  Component Value Date   WBC 9.2 09/07/2020   HGB 12.6 09/07/2020   HCT 40.1 09/07/2020   MCV 89 09/07/2020   PLT 230 09/07/2020   No results found for: IRON, TIBC, FERRITIN  Attestation Statements:   Reviewed by clinician on day of visit: allergies, medications, problem list, medical history, surgical history, family history, social history, and previous encounter notes.   09/09/2020, am acting as transcriptionist for Trude Mcburney, DO.  I have reviewed the above documentation for accuracy and completeness, and I agree with the above. [employer], D.O.  The 21st Century Cures Act was signed into law in 2016 which includes the topic of electronic health records.  This provides immediate  access to information in MyChart.  This includes consultation notes, operative notes, office notes, lab results and pathology reports.  If you have any questions about what you read please let 2017 know at your next visit so we can discuss your concerns and take corrective action if need be.  We are right here with you.

## 2021-05-07 ENCOUNTER — Encounter (INDEPENDENT_AMBULATORY_CARE_PROVIDER_SITE_OTHER): Payer: Self-pay | Admitting: Family Medicine

## 2021-05-07 ENCOUNTER — Ambulatory Visit (INDEPENDENT_AMBULATORY_CARE_PROVIDER_SITE_OTHER): Payer: 59 | Admitting: Family Medicine

## 2021-05-07 ENCOUNTER — Other Ambulatory Visit: Payer: Self-pay

## 2021-05-07 VITALS — BP 113/73 | HR 71 | Temp 98.0°F | Ht 63.0 in | Wt 170.0 lb

## 2021-05-07 DIAGNOSIS — Z6833 Body mass index (BMI) 33.0-33.9, adult: Secondary | ICD-10-CM

## 2021-05-07 DIAGNOSIS — E8881 Metabolic syndrome: Secondary | ICD-10-CM

## 2021-05-07 DIAGNOSIS — E669 Obesity, unspecified: Secondary | ICD-10-CM

## 2021-05-07 DIAGNOSIS — E559 Vitamin D deficiency, unspecified: Secondary | ICD-10-CM

## 2021-05-07 DIAGNOSIS — E88819 Insulin resistance, unspecified: Secondary | ICD-10-CM

## 2021-05-07 MED ORDER — VITAMIN D (ERGOCALCIFEROL) 1.25 MG (50000 UNIT) PO CAPS: 50000.0000 [IU] | ORAL_CAPSULE | ORAL | 0 refills | Status: AC

## 2021-05-08 NOTE — Progress Notes (Signed)
Chief Complaint:   OBESITY Alison Cook is here to discuss her progress with her obesity treatment plan along with follow-up of her obesity related diagnoses. Alison Cook is on the Category 2 Plan and keeping a food journal and adhering to recommended goals of 1100-1200 calories and 85 grams of protein and states she is following her eating plan approximately 50% of the time. Alison Cook states she is doing cardio, yoga, weights and walking for 60 minutes 4-5 times per week.  Today's visit was #: 10 Starting weight: 191 lbs Starting date: 09/07/2020 Today's weight: 170 lbs Today's date: 05/07/2021 Total lbs lost to date: 21 lbs Total lbs lost since last in-office visit: 1 lb  Interim History: Alison Cook does well with journaling Monday - Friday. On the weekends she tends to overeat and eat out. She does not journal on the weekends. She eats mostly fish and chicken. She does not always hit protein goals. She skips meals at times.  She loves fruit. She does choose healthy foods overall.  Subjective:   1. 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 Her last A1C was 5.6. Her hunger is mostly satisfied. She is not on Metformin.   Lab Results  Component Value Date   INSULIN 8.2 09/07/2020   Lab Results  Component Value Date   HGBA1C 5.6 09/07/2020    2. Vitamin D deficiency Alison Cook's Vitamin D is at goal. She is on weekly prescription Vitamin D.  Lab Results  Component Value Date   VD25OH 52.2 03/26/2021   VD25OH 26.5 (L) 09/07/2020    Assessment/Plan:   1. Insulin resistance Alison Cook will continue meal plan. She will increase her protein intake.   2. Vitamin D deficiency  We will refill prescription Vitamin D 50,000 IU every week and Alison Cook will follow-up for routine testing of Vitamin D, at least 2-3 times per year to avoid over-replacement.  - Vitamin D, Ergocalciferol, (DRISDOL) 1.25 MG (50000 UNIT) CAPS capsule; Take 1 capsule (50,000 Units total) by  mouth every 7 (seven) days.  Dispense: 4 capsule; Refill: 0  3. Obesity with current BMI of 30.12 Alison Cook is currently in the action stage of change. As such, her goal is to continue with weight loss efforts. She has agreed to the Category 2 Plan or keeping a food journal and adhering to recommended goals of 1100-1200 calories and 85 grams of protein daily.  Alison Cook will follow Category 2 on weekends for more structure.  She will journal Monday - Friday.  Exercise goals:  As is.  Behavioral modification strategies: increasing lean protein intake.  Alison Cook has agreed to follow-up with our clinic in 3 weeks with Dr. Sharee Holster.  Objective:   Blood pressure 113/73, pulse 71, temperature 98 F (36.7 C), height 5\' 3"  (1.6 m), weight 170 lb (77.1 kg), SpO2 97 %. Body mass index is 30.11 kg/m.  General: Cooperative, alert, well developed, in no acute distress. HEENT: Conjunctivae and lids unremarkable. Cardiovascular: Regular rhythm.  Lungs: Normal work of breathing. Neurologic: No focal deficits.   Lab Results  Component Value Date   CREATININE 0.8 03/01/2021   BUN 21 03/01/2021   NA 139 03/01/2021   K 4.0 03/01/2021   CL 102 03/01/2021   CO2 29 (A) 03/01/2021   Lab Results  Component Value Date   ALT 18 03/01/2021   AST 23 03/01/2021   ALKPHOS 109 09/07/2020   BILITOT 0.6 09/07/2020   Lab Results  Component Value Date  HGBA1C 5.6 09/07/2020   Lab Results  Component Value Date   INSULIN 8.2 09/07/2020   Lab Results  Component Value Date   TSH 0.801 09/07/2020   Lab Results  Component Value Date   CHOL 191 03/01/2021   HDL 65 03/01/2021   LDLCALC 117 03/01/2021   TRIG 47 03/01/2021   CHOLHDL 2.8 09/07/2020   Lab Results  Component Value Date   VD25OH 52.2 03/26/2021   VD25OH 26.5 (L) 09/07/2020   Lab Results  Component Value Date   WBC 9.2 09/07/2020   HGB 12.6 09/07/2020   HCT 40.1 09/07/2020   MCV 89 09/07/2020   PLT 230 09/07/2020   No results found  for: IRON, TIBC, FERRITIN  Attestation Statements:   Reviewed by clinician on day of visit: allergies, medications, problem list, medical history, surgical history, family history, social history, and previous encounter notes.  I, Jackson Latino, RMA, am acting as Energy manager for [place], FNP.  I have reviewed the above documentation for accuracy and completeness, and I agree with the above. -  Jesse Sans, FNP

## 2021-05-28 ENCOUNTER — Ambulatory Visit (INDEPENDENT_AMBULATORY_CARE_PROVIDER_SITE_OTHER): Payer: 59 | Admitting: Family Medicine

## 2021-06-13 ENCOUNTER — Ambulatory Visit (INDEPENDENT_AMBULATORY_CARE_PROVIDER_SITE_OTHER): Payer: 59 | Admitting: Family Medicine

## 2021-06-25 ENCOUNTER — Encounter (INDEPENDENT_AMBULATORY_CARE_PROVIDER_SITE_OTHER): Payer: Self-pay | Admitting: Family Medicine

## 2021-06-25 ENCOUNTER — Other Ambulatory Visit: Payer: Self-pay

## 2021-06-25 ENCOUNTER — Ambulatory Visit (INDEPENDENT_AMBULATORY_CARE_PROVIDER_SITE_OTHER): Payer: 59 | Admitting: Family Medicine

## 2021-06-25 VITALS — BP 107/70 | HR 74 | Temp 97.9°F | Ht 63.0 in | Wt 171.0 lb

## 2021-06-25 DIAGNOSIS — Z6830 Body mass index (BMI) 30.0-30.9, adult: Secondary | ICD-10-CM

## 2021-06-25 DIAGNOSIS — Z6833 Body mass index (BMI) 33.0-33.9, adult: Secondary | ICD-10-CM

## 2021-06-25 DIAGNOSIS — E669 Obesity, unspecified: Secondary | ICD-10-CM

## 2021-06-25 DIAGNOSIS — E559 Vitamin D deficiency, unspecified: Secondary | ICD-10-CM | POA: Diagnosis not present

## 2021-06-25 DIAGNOSIS — I10 Essential (primary) hypertension: Secondary | ICD-10-CM | POA: Diagnosis not present

## 2021-06-25 NOTE — Progress Notes (Signed)
Chief Complaint:   OBESITY Alison Cook is here to discuss her progress with her obesity treatment plan along with follow-up of her obesity related diagnoses. Alison Cook is on the Category 2 Plan and keeping a food journal and adhering to recommended goals of 1100-1200 calories and 85 grams of protein and states she is following her eating plan approximately 50% of the time. Alison Cook states she is walking, weights and water aerobics for 60 minutes 5 times per week.  Today's visit was #: 11 Starting weight: 191 lbs Starting date: 09/07/2020 Today's weight: 171 lbs Today's date: 06/25/2021 Total lbs lost to date: 20 lbs Total lbs lost since last in-office visit: 0  Interim History: Alison Cook did not journal much over the holidays. We had discussed following the cat 2 plan at last OV.  However, here are many foods she won't eat due to additives. She says that her water intake could be better. She cooks dinner most days.   Subjective:   1. Essential hypertension Alison Cook's blood pressure is controlled with olmesartan.   BP Readings from Last 3 Encounters:  06/25/21 107/70  05/07/21 113/73  04/09/21 125/78    2. Vitamin D deficiency Alison Cook's Vitamin D is at goal 52.2. She is on weekly prescription Vitamin D.   Lab Results  Component Value Date   VD25OH 52.2 03/26/2021   VD25OH 26.5 (L) 09/07/2020    Assessment/Plan:   1. Essential hypertension Alison Cook will continue olmesartan.  2. Vitamin D deficiency  Alison Cook will continue prescription Vitamin D 50,000 IU every week and she will follow-up for routine testing of Vitamin D, at least 2-3 times per year to avoid over-replacement.  3. Obesity with current BMI of 30.3 Alison Cook is currently in the action stage of change. As such, her goal is to continue with weight loss efforts. She has agreed to keeping a food journal and adhering to recommended goals of 1000-1200 calories and 80 grams of protein daily.  Alison Cook will journal at least 5 days per  week. We discussed that journaling consistently and meeting calorie/protein goals is the key to her success with weight loss.  Exercise goals:  As is.  Behavioral modification strategies: increasing lean protein intake and keeping a strict food journal.  Alison Cook has agreed to follow-up with our clinic in 2-3 weeks.  Objective:   Blood pressure 107/70, pulse 74, temperature 97.9 F (36.6 C), height 5\' 3"  (1.6 m), weight 171 lb (77.6 kg), SpO2 99 %. Body mass index is 30.29 kg/m.  General: Cooperative, alert, well developed, in no acute distress. HEENT: Conjunctivae and lids unremarkable. Cardiovascular: Regular rhythm.  Lungs: Normal work of breathing. Neurologic: No focal deficits.   Lab Results  Component Value Date   CREATININE 0.8 03/01/2021   BUN 21 03/01/2021   NA 139 03/01/2021   K 4.0 03/01/2021   CL 102 03/01/2021   CO2 29 (A) 03/01/2021   Lab Results  Component Value Date   ALT 18 03/01/2021   AST 23 03/01/2021   ALKPHOS 109 09/07/2020   BILITOT 0.6 09/07/2020   Lab Results  Component Value Date   HGBA1C 5.6 09/07/2020   Lab Results  Component Value Date   INSULIN 8.2 09/07/2020   Lab Results  Component Value Date   TSH 0.801 09/07/2020   Lab Results  Component Value Date   CHOL 191 03/01/2021   HDL 65 03/01/2021   LDLCALC 117 03/01/2021   TRIG 47 03/01/2021   CHOLHDL 2.8 09/07/2020   Lab Results  Component Value Date   VD25OH 52.2 03/26/2021   VD25OH 26.5 (L) 09/07/2020   Lab Results  Component Value Date   WBC 9.2 09/07/2020   HGB 12.6 09/07/2020   HCT 40.1 09/07/2020   MCV 89 09/07/2020   PLT 230 09/07/2020   No results found for: IRON, TIBC, FERRITIN  Attestation Statements:   Reviewed by clinician on day of visit: allergies, medications, problem list, medical history, surgical history, family history, social history, and previous encounter notes.  I, Jackson Latino, RMA, am acting as Energy manager for [place], FNP.  I  have reviewed the above documentation for accuracy and completeness, and I agree with the above. -  Jesse Sans, FNP

## 2021-07-30 ENCOUNTER — Ambulatory Visit (INDEPENDENT_AMBULATORY_CARE_PROVIDER_SITE_OTHER): Payer: 59 | Admitting: Family Medicine

## 2021-12-15 LAB — GLUCOSE, POCT (MANUAL RESULT ENTRY): POC Glucose: 148 mg/dL — AB (ref 70–99)

## 2022-01-16 ENCOUNTER — Encounter (INDEPENDENT_AMBULATORY_CARE_PROVIDER_SITE_OTHER): Payer: Self-pay

## 2022-04-26 ENCOUNTER — Encounter: Payer: Self-pay | Admitting: *Deleted

## 2022-04-29 NOTE — Progress Notes (Signed)
Pt states she saw her PCP Dr. Tally Joe, from [place], last month and is now on a b/p med and has adjusted her diet for low salt and low sugar, continues to walk a mile a day, and is scheduled for her q 5 yr colonoscopy at [facility] early next year. No further [employer] support indicated at this time.

## 2022-06-06 ENCOUNTER — Ambulatory Visit: Payer: 59 | Admitting: Podiatry

## 2022-06-06 ENCOUNTER — Encounter: Payer: Self-pay | Admitting: Podiatry

## 2022-06-06 DIAGNOSIS — L600 Ingrowing nail: Secondary | ICD-10-CM

## 2022-06-06 NOTE — Patient Instructions (Signed)

## 2022-06-07 ENCOUNTER — Encounter: Payer: Self-pay | Admitting: Podiatry

## 2022-06-07 ENCOUNTER — Telehealth: Payer: Self-pay

## 2022-06-07 ENCOUNTER — Other Ambulatory Visit: Payer: Self-pay | Admitting: Podiatry

## 2022-06-07 MED ORDER — IBUPROFEN 600 MG PO TABS: 600.0000 mg | ORAL_TABLET | Freq: Three times a day (TID) | ORAL | 0 refills | Status: AC | PRN

## 2022-06-07 NOTE — Telephone Encounter (Signed)
Sent in prescription 

## 2022-06-07 NOTE — Progress Notes (Signed)
Subjective:   Patient ID: Alison Cook, female   DOB: 61 y.o.   MRN: [id number]   HPI Patient presents with 2 severe thickened nailbeds second and third left that are painful when pressed and very hard for her to take care of her cut.  States that this has been ongoing and more of an issue for her as the years of gone by   ROS      Objective:  Physical Exam  Neuro vas scaler status intact with patient found to have good digital perfusion well-oriented with severely dystrophic thickened nailbeds second and third left that are impossible for her to cut and become painful     Assessment:  Chronic nail disease with thickness and pain of the second and third left     Plan:  Reviewed condition and I do think that these 2 nails given their size and shape would be best removed and I did explain procedure risk of doing this.  Patient wants to have these fixed wants surgery and at this point I went ahead and allowed her to read consent form understanding procedures and I anesthetized the 2 toes with 120 mg Xylocaine Marcaine mixture sterile prep done and using sterile instrumentation remove the second and third nails left exposed matrix applied phenol 5 applications 30 seconds followed by alcohol lavage sterile dressing gave instructions on soaks and to wear dressings for 24 hours and take them off earlier if throbbing were to occur.  Encouraged her to call with questions concerns

## 2022-07-13 ENCOUNTER — Encounter (HOSPITAL_COMMUNITY): Payer: Self-pay | Admitting: *Deleted

## 2022-07-13 ENCOUNTER — Ambulatory Visit (HOSPITAL_COMMUNITY)
Admission: EM | Admit: 2022-07-13 | Discharge: 2022-07-13 | Disposition: A | Discharge status: Home / Self Care | Payer: 59 | Attending: Internal Medicine | Admitting: Internal Medicine

## 2022-07-13 ENCOUNTER — Other Ambulatory Visit: Payer: Self-pay

## 2022-07-13 DIAGNOSIS — J069 Acute upper respiratory infection, unspecified: Secondary | ICD-10-CM | POA: Diagnosis not present

## 2022-07-13 DIAGNOSIS — R059 Cough, unspecified: Secondary | ICD-10-CM | POA: Insufficient documentation

## 2022-07-13 DIAGNOSIS — Z1152 Encounter for screening for COVID-19: Secondary | ICD-10-CM | POA: Diagnosis not present

## 2022-07-13 MED ORDER — BENZONATATE 100 MG PO CAPS: 100.0000 mg | ORAL_CAPSULE | Freq: Three times a day (TID) | ORAL | 0 refills | Status: DC

## 2022-07-13 MED ORDER — PROMETHAZINE-DM 6.25-15 MG/5ML PO SYRP: 5.0000 mL | ORAL_SOLUTION | Freq: Every evening | ORAL | 0 refills | Status: AC | PRN

## 2022-07-13 NOTE — ED Provider Notes (Signed)
[facility]    CSN: [id number] Arrival date & time: 07/13/22  1203      History   Chief Complaint Chief Complaint  Patient presents with   Cough   Nasal Congestion   Back Pain    HPI Alison Cook is a 62 y.o. female.   Patient presents urgent care for evaluation of cough, nasal congestion, body aches, and sore throat that started 3 days ago.  Cough is productive with yellow/clear phlegm and worse at nighttime.  She has not had any fever, chills, nausea, vomiting, abdominal pain, urinary symptoms, or shortness of breath/chest pain.  Tolerating food and fluids well without diarrhea or vomiting.  Denies history of chronic respiratory problems.  She is not a smoker and denies drug use.  She has been using over-the-counter medications to help with symptoms without relief prior to arrival urgent care.   Cough Back Pain   Past Medical History:  Diagnosis Date   Anxiety    Asthma    Back pain    Chest pain    Chronic headaches    Chronic pain of both feet    Depression    Difficulty sleeping    Essential hypertension    Heartburn    Hypertension    Knee pain    SOBOE (shortness of breath on exertion)    Stomach ulcer    Swelling of both lower extremities    Vitamin D deficiency     Patient Active Problem List   Diagnosis Date Noted   At risk for dehydration 10/26/2020   At risk for impaired metabolic function 16/12/3708   Insulin resistance 10/05/2020   Other hyperlipidemia 10/05/2020   Major depressive disorder 10/05/2020   Other fatigue 09/07/2020   SOBOE (shortness of breath on exertion) 09/07/2020   Essential hypertension 09/07/2020   Vitamin D deficiency 09/07/2020   Mood disorder ([place]), with emtional eating 09/07/2020   At risk for depression 09/07/2020   Class 1 obesity with serious comorbidity and body mass index (BMI) of 33.0 to 33.9 in adult 09/07/2020    Past Surgical History:  Procedure Laterality Date   OVARIAN CYST REMOVAL  1998     OB History     Gravida  1   Para  1   Term      Preterm      AB      Living         SAB      IAB      Ectopic      Multiple      Live Births               Home Medications    Prior to Admission medications   Medication Sig Start Date End Date Taking? Authorizing Provider  benzonatate (TESSALON) 100 MG capsule Take 1 capsule (100 mg total) by mouth every 8 (eight) hours. 07/13/22  Yes Talbot Grumbling, FNP  ibuprofen (ADVIL) 600 MG tablet Take 1 tablet (600 mg total) by mouth every 8 (eight) hours as needed. 06/07/22   Wallene Huh, DPM  promethazine-dextromethorphan (PROMETHAZINE-DM) 6.25-15 MG/5ML syrup Take 5 mLs by mouth at bedtime as needed for cough. 07/13/22  Yes Talbot Grumbling, FNP  Azelastine-Fluticasone 137-50 MCG/ACT SUSP Place 1 spray into both nostrils daily. 10/26/20   Kennith Gain, MD  BEE POLLEN PO Take by mouth.    [provider]  Cholecalciferol (VITAMIN D3) 10 MCG (400 UNIT) CAPS Take by mouth.  [provider]  Fexofenadine HCl (ALLEGRA ALLERGY PO)     [provider]  fluticasone (FLONASE) 50 MCG/ACT nasal spray 1-2 sprays 12/12/17   [provider]  levocetirizine (XYZAL) 5 MG tablet Take 5 mg by mouth every evening.    [provider]  loratadine (CLARITIN) 10 MG tablet Take 10 mg by mouth daily.    [provider]  metFORMIN (GLUCOPHAGE) 500 MG tablet 1 tablet with a meal    [provider]  montelukast (SINGULAIR) 10 MG tablet Take 1 tablet (10 mg total) by mouth at bedtime. 10/26/20   Kennith Gain, MD  montelukast (SINGULAIR) 10 MG tablet 1 tablet    [provider]  Multiple Vitamin (MULTIVITAMINS PO) See admin instructions.    [provider]  naproxen sodium (ALEVE) 220 MG tablet Take 220 mg by mouth daily as needed.    [provider]  olmesartan (BENICAR) 40 MG tablet Take 40 mg by mouth daily. 07/20/20    [provider]  UNABLE TO FIND Med Name: OTC Diabetic Tussin Align Probiotic Glucocil-Blood sugar Opitmizer CoQ10 Zinc Magnesium    [provider]  Vitamin D, Ergocalciferol, (DRISDOL) 1.25 MG (50000 UNIT) CAPS capsule Take 1 capsule (50,000 Units total) by mouth every 7 (seven) days. 05/07/21   [place], Joneen Boers, FNP    Family History Family History  Problem Relation Age of Onset   Hypertension Mother    Cancer Mother    Hypertension Father    Heart disease Father    Cancer Father    Sleep apnea Father    Allergic rhinitis Father    Angioedema Neg Hx    Asthma Neg Hx    Atopy Neg Hx    Eczema Neg Hx    Immunodeficiency Neg Hx    Urticaria Neg Hx     Social History Social History   Tobacco Use   Smoking status: Never   Smokeless tobacco: Never  Vaping Use   Vaping Use: Never used  Substance Use Topics   Alcohol use: Yes    Alcohol/week: 1.0 standard drink of alcohol    Types: 1 Glasses of wine per week    Comment: occ   Drug use: Never     Allergies   Banana and Guaifenesin er   Review of Systems Review of Systems  Respiratory:  Positive for cough.   Musculoskeletal:  Positive for back pain.  Per HPI   Physical Exam Triage Vital Signs ED Triage Vitals  Enc Vitals Group     BP 07/13/22 1429 (!) 145/85     Pulse Rate 07/13/22 1429 70     Resp 07/13/22 1429 18     Temp 07/13/22 1429 98.2 F (36.8 C)     Temp src --      SpO2 07/13/22 1429 95 %     Weight --      Height --      Head Circumference --      Peak Flow --      Pain Score 07/13/22 1427 0     Pain Loc --      Pain Edu? --      Excl. in [place]? --    No data found.  Updated Vital Signs BP (!) 145/85   Pulse 70   Temp 98.2 F (36.8 C)   Resp 18   SpO2 95%   Visual Acuity Right Eye Distance:   Left Eye Distance:   Bilateral Distance:  Right Eye Near:   Left Eye Near:    Bilateral Near:     Physical Exam Vitals and nursing note reviewed.   Constitutional:      Appearance: She is not ill-appearing or toxic-appearing.  HENT:     Head: Normocephalic and atraumatic.     Right Ear: Hearing, tympanic membrane, ear canal and external ear normal.     Left Ear: Hearing, tympanic membrane, ear canal and external ear normal.     Nose: Congestion present.     Mouth/Throat:     Lips: Pink.     Mouth: Mucous membranes are moist.     Pharynx: Posterior oropharyngeal erythema present.     Comments: Small amount of clear postnasal drainage visualized to the posterior oropharynx.  Eyes:     General: Lids are normal. Vision grossly intact. Gaze aligned appropriately.     Extraocular Movements: Extraocular movements intact.     Conjunctiva/sclera: Conjunctivae normal.  Cardiovascular:     Rate and Rhythm: Normal rate and regular rhythm.     Heart sounds: Normal heart sounds, S1 normal and S2 normal.  Pulmonary:     Effort: Pulmonary effort is normal. No respiratory distress.     Breath sounds: Normal breath sounds and air entry.  Musculoskeletal:     Cervical back: Neck supple.     Right lower leg: No edema.     Left lower leg: No edema.  Skin:    General: Skin is warm and dry.     Capillary Refill: Capillary refill takes less than 2 seconds.     Findings: No rash.  Neurological:     General: No focal deficit present.     Mental Status: She is alert and oriented to person, place, and time. Mental status is at baseline.     Cranial Nerves: No dysarthria or facial asymmetry.  Psychiatric:        Mood and Affect: Mood normal.        Speech: Speech normal.        Behavior: Behavior normal.        Thought Content: Thought content normal.        Judgment: Judgment normal.      UC Treatments / Results  Labs (all labs ordered are listed, but only abnormal results are displayed) Labs Reviewed  SARS CORONAVIRUS 2 (TAT 6-24 HRS)    EKG   Radiology No results found.  Procedures Procedures (including critical care  time)  Medications Ordered in UC Medications - No data to display  Initial Impression / Assessment and Plan / UC Course  I have reviewed the triage vital signs and the nursing notes.  Pertinent labs & imaging results that were available during my care of the patient were reviewed by me and considered in my medical decision making (see chart for details).   1. Viral URI with cough Symptoms and physical exam consistent with a viral upper respiratory tract infection that will likely resolve with rest, fluids, and prescriptions for symptomatic relief. Deferred imaging based on stable cardiopulmonary exam and hemodynamically stable vital signs.  COVID-19 testing is pending.  We will call patient if this is positive.  Quarantine guidelines discussed. Currently on day 3 of symptoms and does qualify for antiviral therapy.   Tessalon Perles and Promethazine DM sent to pharmacy for symptomatic relief to be taken as prescribed.  May continue taking over the counter medications as directed for further symptomatic relief.  Drowsiness precautions discussed regarding promethazine DM prescription.  Nonpharmacologic interventions for symptom relief provided and after visit summary below. Advised to push fluids to stay well hydrated while recovering from viral illness.   Discussed physical exam and available lab work findings in clinic with patient.  Counseled patient regarding appropriate use of medications and potential side effects for all medications recommended or prescribed today. Discussed red flag signs and symptoms of worsening condition,when to call the PCP office, return to urgent care, and when to seek higher level of care in the emergency department. Patient verbalizes understanding and agreement with plan. All questions answered. Patient discharged in stable condition.    Final Clinical Impressions(s) / UC Diagnoses   Final diagnoses:  Viral URI with cough     Discharge Instructions      You  have a viral upper respiratory infection.  COVID-19 testing is pending. We will call you with results if positive. If your COVID test is positive, you must stay at home until day 6 of symptoms. On day 6, you may go out into public and go back to work, but you must wear a mask until day 11 of symptoms to prevent spread to others.  Use the following medicines to help with symptoms: - Continue use of one allergy medication either xyzal, allegra, or zyrtec 24 hour antihistamine. - Tylenol 1,000mg  and/or ibuprofen 600mg  every 6 hours with food as needed for aches/pains or fever/chills.  - Tessalon perles every 8 hours as needed for cough. - Take Promethazine DM cough medication to help with your cough at nighttime so that you are able to sleep. Do not drive, drink alcohol, or go to work while taking this medication since it can make you sleepy. Only take this at nighttime.   1 tablespoon of honey in warm water and/or salt water gargles may also help with symptoms. Humidifier to your room will help add water to the air and reduce coughing.  If you develop any new or worsening symptoms, please return.  If your symptoms are severe, please go to the emergency room.  Follow-up with your primary care provider for further evaluation and management of your symptoms as well as ongoing wellness visits.  I hope you feel better!    ED Prescriptions     Medication Sig Dispense Auth. Provider   benzonatate (TESSALON) 100 MG capsule Take 1 capsule (100 mg total) by mouth every 8 (eight) hours. 21 capsule , FNP   promethazine-dextromethorphan (PROMETHAZINE-DM) 6.25-15 MG/5ML syrup Take 5 mLs by mouth at bedtime as needed for cough. 118 mL 08-12-1990, FNP      PDMP not reviewed this encounter.   Carlisle Beers, Carlisle Beers 07/13/22 2124

## 2022-07-13 NOTE — ED Triage Notes (Signed)
Pt reports cough ,congestion and back pain.

## 2022-07-13 NOTE — Discharge Instructions (Addendum)
You have a viral upper respiratory infection.  COVID-19 testing is pending. We will call you with results if positive. If your COVID test is positive, you must stay at home until day 6 of symptoms. On day 6, you may go out into public and go back to work, but you must wear a mask until day 11 of symptoms to prevent spread to others.  Use the following medicines to help with symptoms: - Continue use of one allergy medication either xyzal, allegra, or zyrtec 24 hour antihistamine. - Tylenol 1,000mg  and/or ibuprofen 600mg  every 6 hours with food as needed for aches/pains or fever/chills.  - Tessalon perles every 8 hours as needed for cough. - Take Promethazine DM cough medication to help with your cough at nighttime so that you are able to sleep. Do not drive, drink alcohol, or go to work while taking this medication since it can make you sleepy. Only take this at nighttime.   1 tablespoon of honey in warm water and/or salt water gargles may also help with symptoms. Humidifier to your room will help add water to the air and reduce coughing.  If you develop any new or worsening symptoms, please return.  If your symptoms are severe, please go to the emergency room.  Follow-up with your primary care provider for further evaluation and management of your symptoms as well as ongoing wellness visits.  I hope you feel better!

## 2022-07-14 LAB — SARS CORONAVIRUS 2 (TAT 6-24 HRS): SARS Coronavirus 2: NEGATIVE

## 2022-10-03 ENCOUNTER — Encounter: Payer: Self-pay | Admitting: Physician Assistant

## 2022-10-03 ENCOUNTER — Ambulatory Visit (INDEPENDENT_AMBULATORY_CARE_PROVIDER_SITE_OTHER): Payer: 59 | Admitting: Physician Assistant

## 2022-10-03 ENCOUNTER — Other Ambulatory Visit (INDEPENDENT_AMBULATORY_CARE_PROVIDER_SITE_OTHER): Payer: 59

## 2022-10-03 DIAGNOSIS — M25561 Pain in right knee: Secondary | ICD-10-CM | POA: Diagnosis not present

## 2022-10-03 MED ORDER — LIDOCAINE 5 % EX PTCH: 1.0000 | MEDICATED_PATCH | CUTANEOUS | 0 refills | Status: AC

## 2022-10-03 NOTE — Progress Notes (Signed)
Office Visit Note   Patient: Alison Cook           Date of Birth: 09/10/1960           MRN: [id number] Visit Date: 10/03/2022              Requested by: Tally Joe, MD [phone] [address] [place],  [place] [address] PCP: Tally Joe, MD   Assessment & Plan: Visit Diagnoses:  1. Acute pain of right knee     Plan: Pleasant active 62 year old woman who is 1 month status post falling off a two-step stepladder.  She did not think much of this she fell directly onto her knee.  She has she had some swelling but no bruising.  She was concerned because when she got up Tuesday she had pain in her knee gave out on her.  No previous history of knee issues.  She has good stability with varus valgus testing and testing of her ACL.  She has no effusion today.  She does have some tenderness over the patella tendon itself but has good activation and strength.  Also little tenderness over the medial joint line.  She does say she has felt like something catches.  Will try treating this symptomatically with some Voltaren gel and some quadricep strengthening which have demonstrated to her.  I have asked that she give this a few more weeks if she still had continued symptoms would consider an MRI  Follow-Up Instructions: Return if symptoms worsen or fail to improve.   Orders:  Orders Placed This Encounter  Procedures   XR KNEE 3 VIEW RIGHT   Meds ordered this encounter  Medications   lidocaine (LIDODERM) 5 %    Sig: Place 1 patch onto the skin daily. Remove & Discard patch within 12 hours or as directed by MD    Dispense:  10 patch    Refill:  0      Procedures: No procedures performed   Clinical Data: No additional findings.   Subjective: Chief Complaint  Patient presents with   Right Knee - Pain    HPI pleasant 62 year old woman who is 1 month status post falling off a small step ladder and landing on her right knee.  She had some swelling at the time but no  instability or bruising.  She became concerned this past Tuesday when she got out of bed and the knee gave out on her.  Review of Systems  All other systems reviewed and are negative.    Objective: Vital Signs: There were no vitals taken for this visit.  Physical Exam Constitutional:      Appearance: Normal appearance.  Pulmonary:     Effort: Pulmonary effort is normal.  Skin:    General: Skin is warm and dry.  Neurological:     General: No focal deficit present.     Mental Status: She is alert.     Ortho Exam Examination of her right knee she has no effusion no redness no skin breakdown.  She has good varus valgus anterior and posterior stability.  Her compartments are soft and nontender she has good flexion and with good strength of her ankle.  She is neurovascular intact.  She has some tenderness with palpation over the patellar tendon.  Also little tender over the medial joint line.  She has excellent strength with resisted extension and flexion.  Cannot palpate any defect in either quadriceps tendon or her patellar tendon Specialty Comments:  No specialty comments available.  Imaging: XR KNEE 3 VIEW RIGHT  Result Date: 10/03/2022 Three-view radiographs of her right knee were obtained today.  Overall fairly well-preserved joint spacing.  No evidence of any fracture or dislocation    PMFS History: Patient Active Problem List   Diagnosis Date Noted   At risk for dehydration 10/26/2020   At risk for impaired metabolic function 10/05/2020   Insulin resistance 10/05/2020   Other hyperlipidemia 10/05/2020   Major depressive disorder 10/05/2020   Other fatigue 09/07/2020   SOBOE (shortness of breath on exertion) 09/07/2020   Essential hypertension 09/07/2020   Vitamin D deficiency 09/07/2020   Mood disorder ([facility]), with emtional eating 09/07/2020   At risk for depression 09/07/2020   Class 1 obesity with serious comorbidity and body mass index (BMI) of 33.0 to 33.9 in  adult 09/07/2020   Past Medical History:  Diagnosis Date   Anxiety    Asthma    Back pain    Chest pain    Chronic headaches    Chronic pain of both feet    Depression    Difficulty sleeping    Essential hypertension    Heartburn    Hypertension    Knee pain    SOBOE (shortness of breath on exertion)    Stomach ulcer    Swelling of both lower extremities    Vitamin D deficiency     Family History  Problem Relation Age of Onset   Hypertension Mother    Cancer Mother    Hypertension Father    Heart disease Father    Cancer Father    Sleep apnea Father    Allergic rhinitis Father    Angioedema Neg Hx    Asthma Neg Hx    Atopy Neg Hx    Eczema Neg Hx    Immunodeficiency Neg Hx    Urticaria Neg Hx     Past Surgical History:  Procedure Laterality Date   OVARIAN CYST REMOVAL  1998   Social History   Occupational History   Occupation: Environmental manager  Tobacco Use   Smoking status: Never   Smokeless tobacco: Never  Vaping Use   Vaping Use: Never used  Substance and Sexual Activity   Alcohol use: Yes    Alcohol/week: 1.0 standard drink of alcohol    Types: 1 Glasses of wine per week    Comment: occ   Drug use: Never   Sexual activity: Not on file

## 2023-01-23 IMAGING — DX DG CHEST 2V
2 series · 2 of 2 positions shown · non-contrast
Comparison: None.

CLINICAL DATA: Wheezing and shortness breath.  COVID exposure.

EXAM:
CHEST - 2 VIEW

[chest pa]
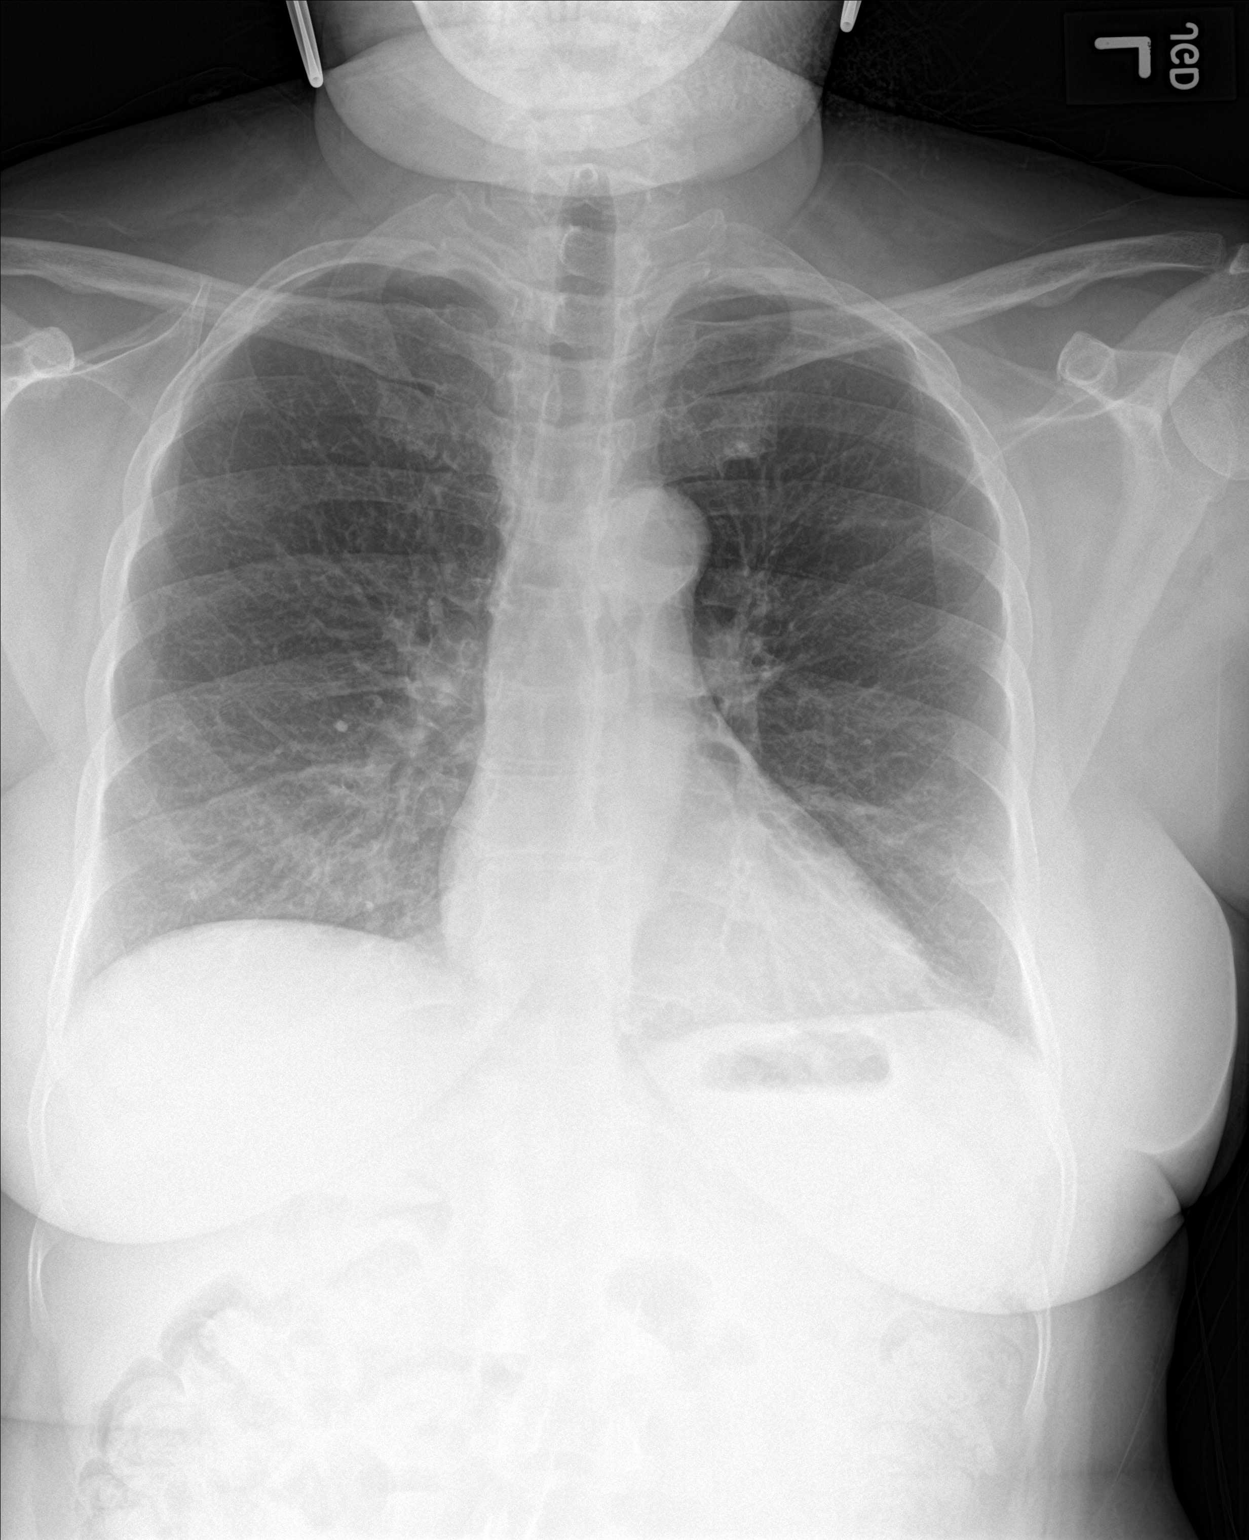

[chest lat]
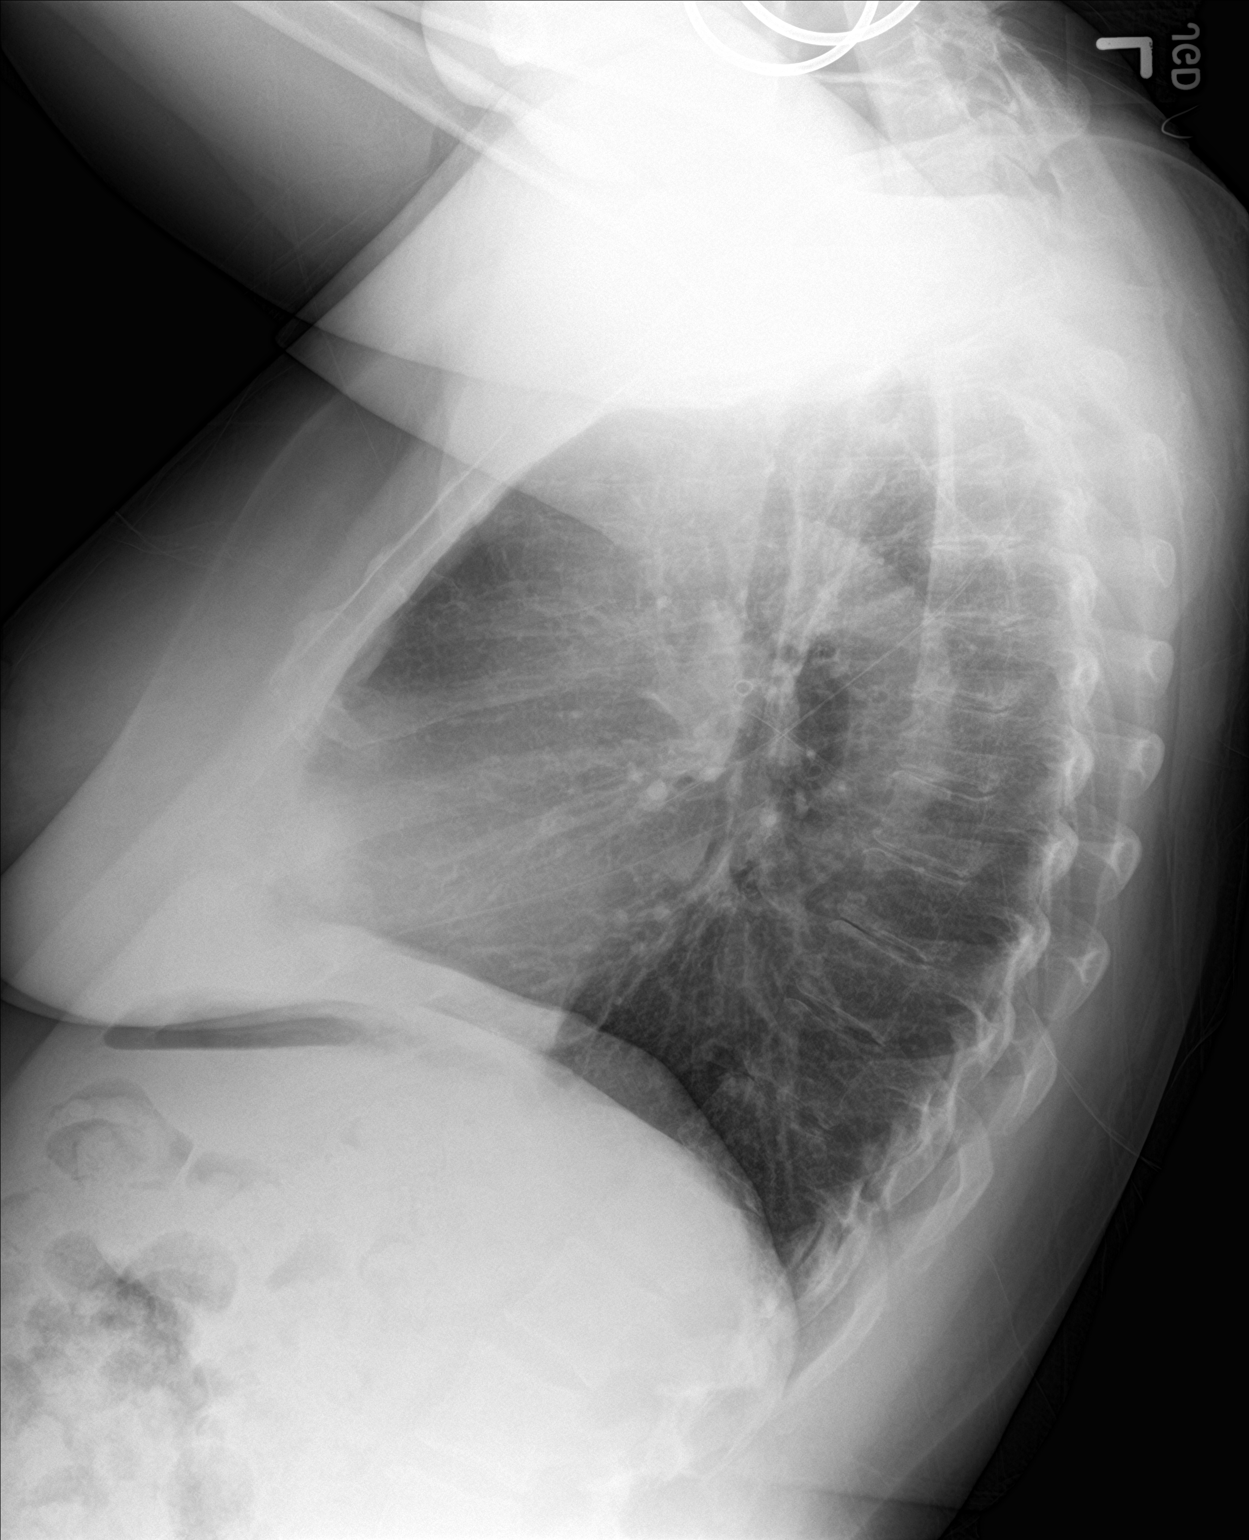

[2 of 2 positions shown; findings below may reference images not displayed]

FINDINGS: Heart size is normal. Patchy airspace opacities are present right
middle lobe and lingula. No edema or effusion is present. Axial
skeleton is within normal limits.
IMPRESSION: Patchy airspace opacities in the right middle lobe and lingula,
concerning for pneumonia.

## 2023-06-02 ENCOUNTER — Other Ambulatory Visit: Payer: Self-pay | Admitting: Family Medicine

## 2023-06-02 DIAGNOSIS — Z006 Encounter for examination for normal comparison and control in clinical research program: Secondary | ICD-10-CM

## 2023-06-25 ENCOUNTER — Ambulatory Visit
Admission: RE | Admit: 2023-06-25 | Discharge: 2023-06-25 | Discharge status: Home / Self Care | Payer: 59 | Source: Ambulatory Visit | Attending: Family Medicine | Admitting: Family Medicine

## 2023-06-25 ENCOUNTER — Other Ambulatory Visit: Payer: Self-pay | Admitting: Family Medicine

## 2023-06-25 DIAGNOSIS — Z006 Encounter for examination for normal comparison and control in clinical research program: Secondary | ICD-10-CM

## 2023-07-01 ENCOUNTER — Ambulatory Visit
Admission: RE | Admit: 2023-07-01 | Discharge: 2023-07-01 | Disposition: A | Discharge status: Home / Self Care | Payer: Self-pay | Source: Ambulatory Visit | Attending: Family Medicine | Admitting: Family Medicine

## 2023-07-01 DIAGNOSIS — Z006 Encounter for examination for normal comparison and control in clinical research program: Secondary | ICD-10-CM

## 2023-07-15 DIAGNOSIS — B351 Tinea unguium: Secondary | ICD-10-CM | POA: Insufficient documentation

## 2023-09-12 DIAGNOSIS — H6993 Unspecified Eustachian tube disorder, bilateral: Secondary | ICD-10-CM | POA: Insufficient documentation

## 2023-10-24 ENCOUNTER — Encounter: Payer: Self-pay | Admitting: Emergency Medicine

## 2023-10-24 ENCOUNTER — Ambulatory Visit
Admission: RE | Admit: 2023-10-24 | Discharge: 2023-10-24 | Disposition: A | Discharge status: Home / Self Care | Source: Ambulatory Visit

## 2023-10-24 DIAGNOSIS — E669 Obesity, unspecified: Secondary | ICD-10-CM | POA: Insufficient documentation

## 2023-10-24 DIAGNOSIS — U071 COVID-19: Secondary | ICD-10-CM | POA: Diagnosis not present

## 2023-10-24 DIAGNOSIS — K5900 Constipation, unspecified: Secondary | ICD-10-CM | POA: Insufficient documentation

## 2023-10-24 DIAGNOSIS — J309 Allergic rhinitis, unspecified: Secondary | ICD-10-CM | POA: Insufficient documentation

## 2023-10-24 DIAGNOSIS — R12 Heartburn: Secondary | ICD-10-CM | POA: Insufficient documentation

## 2023-10-24 DIAGNOSIS — E7800 Pure hypercholesterolemia, unspecified: Secondary | ICD-10-CM | POA: Insufficient documentation

## 2023-10-24 DIAGNOSIS — K219 Gastro-esophageal reflux disease without esophagitis: Secondary | ICD-10-CM | POA: Insufficient documentation

## 2023-10-24 DIAGNOSIS — F439 Reaction to severe stress, unspecified: Secondary | ICD-10-CM | POA: Insufficient documentation

## 2023-10-24 LAB — POC SARS CORONAVIRUS 2 AG -  ED: SARS Coronavirus 2 Ag: POSITIVE — AB

## 2023-10-24 MED ORDER — ALBUTEROL SULFATE HFA 108 (90 BASE) MCG/ACT IN AERS: 2.0000 | INHALATION_SPRAY | RESPIRATORY_TRACT | 0 refills | Status: AC | PRN

## 2023-10-24 MED ORDER — BENZONATATE 200 MG PO CAPS: 200.0000 mg | ORAL_CAPSULE | Freq: Three times a day (TID) | ORAL | 0 refills | Status: AC | PRN

## 2023-10-24 NOTE — ED Triage Notes (Signed)
 Pt reports nasal congestion that started yesterday and tested positive for COVID-19 at home. Pt had a fever yesterday that has since broke per pt. No med use at home other than tylenol.

## 2023-10-24 NOTE — Discharge Instructions (Addendum)
 Your COVID test is positive.  Recommend symptom management with antihistamine to help with congestion and postnasal drainage.  I have refilled your albuterol inhaler which I recommend use if you develop any persistent coughing or chest tightness with breathing , and/or wheezing.  I have also prescribed you Tessalon  Perles which you can take up to 3 times daily as needed if any cough or he develops.  Well with fluids.  Return to normal activities as tolerated.  As long as you are afebrile you are considered safe to be around others however around high risk individuals with low immune system or chronic disease recommend wearing a mask up to 7 days from your onset of test date.  If any red flag symptoms such as severe shortness of breath, chest tightness, durations in middle stiffness or weakness develops go to the nearest emergency department.    Call me tomorrow if any of your symptoms have remained unchanged or worsened and I will send over Paxlovid.

## 2023-10-24 NOTE — ED Provider Notes (Signed)
 [facility]    CSN: [id number] Arrival date & time: 10/24/23  1152      History   Chief Complaint Chief Complaint  Patient presents with   Nasal Congestion    HPI Alison Cook is a 63 y.o. female.   HPI Patient here today with a complaint of congestion x 2 days.  Patient took a home COVID test and tested positive at home. Some mild cough yesterday however cough has completely resolved.  Has received all available COVID-19 vaccines.  Never had COVID previously.  She is not having any difficulty breathing, chest tightness or wheezing.  Not she has not had any fever.  She has been taking her normal allergy medication and Tylenol.  She reports she just feels some fatigue.  He is requesting a repeat COVID test as she is uncertain perform the test appropriately and it was expired.  Past Medical History:  Diagnosis Date   Anxiety    Asthma    Back pain    Chest pain    Chronic headaches    Chronic pain of both feet    Depression    Difficulty sleeping    Essential hypertension    Heartburn    Hypertension    Knee pain    SOBOE (shortness of breath on exertion)    Stomach ulcer    Swelling of both lower extremities    Vitamin D  deficiency     Patient Active Problem List   Diagnosis Date Noted   Allergic rhinitis 10/24/2023   Constipation 10/24/2023   Elevated LDL cholesterol level 10/24/2023   Gastroesophageal reflux disease 10/24/2023   Heartburn 10/24/2023   Situational stress 10/24/2023   Obesity with body mass index 30 or greater 10/24/2023   Dysfunction of both eustachian tubes 09/12/2023   Onychomycosis due to dermatophyte 07/15/2023   At risk for dehydration 10/26/2020   At risk for impaired metabolic function 10/05/2020   Insulin resistance 10/05/2020   Other hyperlipidemia 10/05/2020   Major depressive disorder 10/05/2020   Other fatigue 09/07/2020   SOBOE (shortness of breath on exertion) 09/07/2020   Essential (primary) hypertension  09/07/2020   Vitamin D  deficiency 09/07/2020   Mood disorder (HCC), with emtional eating 09/07/2020   At risk for depression 09/07/2020   Class 1 obesity with serious comorbidity and body mass index (BMI) of 33.0 to 33.9 in adult 09/07/2020   At high risk for breast cancer 08/14/2020   Polyp of corpus uteri 06/06/2020    Past Surgical History:  Procedure Laterality Date   OVARIAN CYST REMOVAL  1998    OB History     Gravida  1   Para  1   Term      Preterm      AB      Living         SAB      IAB      Ectopic      Multiple      Live Births               Home Medications    Prior to Admission medications   Medication Sig Start Date End Date Taking? Authorizing Provider  acetaminophen (TYLENOL) 500 MG tablet Take 500 mg by mouth.   Yes [provider]  albuterol  (VENTOLIN  HFA) 108 (90 Base) MCG/ACT inhaler Inhale 2 puffs into the lungs every 4 (four) hours as needed for wheezing or shortness of breath. 10/24/23  Yes Buena Carmine, NP  Azelastine -Fluticasone  137-50 MCG/ACT SUSP Place 1 spray into both nostrils daily. 10/26/20  Yes Padgett, Rhoderick Ceo, MD  benzonatate  (TESSALON ) 200 MG capsule Take 1 capsule (200 mg total) by mouth 3 (three) times daily as needed for cough. 10/24/23  Yes Buena Carmine, NP  Biotin (CYTO B7) 10 MG/ML LIQD Take by mouth.   Yes [provider]  cetirizine (ZYRTEC) 10 MG tablet TAKE 1 TABLET BY MOUTH EVERY DAY FOR 30 DAYS 08/28/23  Yes [provider]  Cholecalciferol (VITAMIN D3) 10 MCG (400 UNIT) CAPS Take by mouth.   Yes [provider]  Coenzyme Q10 10 MG capsule Take 100 mg by mouth.   Yes [provider]  fluticasone  Odis Bennetts) 50 MCG/ACT nasal spray 1-2 sprays 12/12/17  Yes [provider]  ibuprofen  (ADVIL ) 600 MG tablet Take 1 tablet (600 mg total) by mouth every 8 (eight) hours as needed. 06/07/22  Yes Regal, Angus Kenning, DPM  Magnesium 250 MG TABS 1 tablet with a  meal Orally Once a day   Yes [provider]  Multiple Vitamin (MULTIVITAMINS PO) See admin instructions.   Yes [provider]  olmesartan (BENICAR) 40 MG tablet Take 40 mg by mouth daily. 07/20/20  Yes [provider]  Omega 3 1000 MG CAPS 1 capsule.   Yes [provider]  amoxicillin (AMOXIL) 500 MG capsule 1 capsule. Patient not taking: Reported on 10/24/2023    [provider]  BEE POLLEN PO Take by mouth. Patient not taking: Reported on 10/24/2023    [provider]  cholecalciferol (VITAMIN D3) 25 MCG (1000 UNIT) tablet 1 tablet Orally every other day Patient not taking: Reported on 10/24/2023    [provider]  fexofenadine (ALLEGRA ALLERGY) 180 MG tablet 1 tablet Swallow whole with water; do not take with fruit juices. Orally Once a day Patient not taking: Reported on 10/24/2023    [provider]  hydrochlorothiazide (HYDRODIURIL) 25 MG tablet Take 25 mg by mouth daily. Patient not taking: Reported on 10/24/2023 05/12/23   [provider]  levocetirizine (XYZAL ALLERGY 24HR) 5 MG tablet 1 tablet in the evening Orally Once a day Patient not taking: Reported on 10/24/2023    [provider]  lidocaine  (LIDODERM ) 5 % Place 1 patch onto the skin daily. Remove & Discard patch within 12 hours or as directed by MD 10/03/22   Persons, Norma Beckers, PA  loratadine (CLARITIN) 10 MG tablet 1 tablet Orally Once a day Patient not taking: Reported on 10/24/2023    [provider]  metFORMIN  (GLUCOPHAGE ) 500 MG tablet 1 tablet with a meal Patient not taking: Reported on 10/24/2023    [provider]  montelukast  (SINGULAIR ) 10 MG tablet Take 1 tablet (10 mg total) by mouth at bedtime. Patient not taking: Reported on 10/24/2023 10/26/20   Brian Campanile, MD  montelukast  (SINGULAIR ) 10 MG tablet 1 tablet Patient not taking: Reported on 10/24/2023    [provider]  naproxen sodium  (ALEVE) 220 MG tablet Take 220 mg by mouth daily as needed.    [provider]  predniSONE  (DELTASONE ) 10 MG tablet Take by mouth. Patient not taking: Reported on 10/24/2023    [provider]  promethazine -dextromethorphan (PROMETHAZINE -DM) 6.25-15 MG/5ML syrup Take 5 mLs by mouth at bedtime as needed for cough. Patient not taking: Reported on 10/24/2023 07/13/22   Starlene Eaton, FNP  terbinafine (LAMISIL) 250 MG tablet 1 tablet Orally Once a day Patient not taking: Reported on  10/24/2023    [provider]  UNABLE TO FIND Med Name: OTC Diabetic Tussin Align Probiotic Glucocil-Blood sugar Opitmizer CoQ10 Zinc Magnesium Patient not taking: Reported on 10/24/2023    [provider]  Vitamin D , Ergocalciferol , (DRISDOL ) 1.25 MG (50000 UNIT) CAPS capsule Take 1 capsule (50,000 Units total) by mouth every 7 (seven) days. Patient not taking: Reported on 10/24/2023 05/07/21   Whitmire, Idamae Maize, FNP    Family History Family History  Problem Relation Age of Onset   Hypertension Mother    Cancer Mother    Hypertension Father    Heart disease Father    Cancer Father    Sleep apnea Father    Allergic rhinitis Father    Angioedema Neg Hx    Asthma Neg Hx    Atopy Neg Hx    Eczema Neg Hx    Immunodeficiency Neg Hx    Urticaria Neg Hx     Social History Social History   Tobacco Use   Smoking status: Never   Smokeless tobacco: Never  Vaping Use   Vaping status: Never Used  Substance Use Topics   Alcohol use: Yes    Alcohol/week: 1.0 standard drink of alcohol    Types: 1 Glasses of wine per week    Comment: occ   Drug use: Never     Allergies   Banana, Guaifenesin, Short ragweed pollen ext, and Guaifenesin er   Review of Systems Review of Systems   Physical Exam Triage Vital Signs ED Triage Vitals  Encounter Vitals Group     BP 10/24/23 1224 105/69     Systolic BP Percentile --      Diastolic BP Percentile --      Pulse Rate  10/24/23 1224 74     Resp 10/24/23 1224 18     Temp 10/24/23 1224 98 F (36.7 C)     Temp Source 10/24/23 1224 Oral     SpO2 10/24/23 1224 95 %     Weight --      Height --      Head Circumference --      Peak Flow --      Pain Score 10/24/23 1217 0     Pain Loc --      Pain Education --      Exclude from Growth Chart --    No data found.  Updated Vital Signs BP 105/69 (BP Location: Left Arm)   Pulse 74   Temp 98 F (36.7 C) (Oral)   Resp 18   SpO2 95%   Visual Acuity Right Eye Distance:   Left Eye Distance:   Bilateral Distance:    Right Eye Near:   Left Eye Near:    Bilateral Near:     Physical Exam Vitals reviewed.  Constitutional:      General: She is not in acute distress.    Appearance: Normal appearance.  HENT:     Head: Normocephalic and atraumatic.     Right Ear: External ear normal.     Left Ear: External ear normal.     Nose: Congestion and rhinorrhea present.     Mouth/Throat:     Mouth: Mucous membranes are moist.     Pharynx: No oropharyngeal exudate or posterior oropharyngeal erythema.  Eyes:     Extraocular Movements: Extraocular movements intact.     Pupils: Pupils are equal, round, and reactive to light.  Cardiovascular:     Rate and Rhythm: Normal rate and regular rhythm.  Pulmonary:  Effort: Pulmonary effort is normal.     Breath sounds: Normal breath sounds.  Musculoskeletal:     Cervical back: Normal range of motion.  Skin:    General: Skin is warm and dry.  Neurological:     General: No focal deficit present.     Mental Status: She is alert.      UC Treatments / Results  Labs (all labs ordered are listed, but only abnormal results are displayed) Labs Reviewed  POC SARS CORONAVIRUS 2 AG -  ED - Abnormal; Notable for the following components:      Result Value   SARS Coronavirus 2 Ag Positive (*)    All other components within normal limits    EKG   Radiology No results found.  Procedures Procedures (including  critical care time)  Medications Ordered in UC Medications - No data to display  Initial Impression / Assessment and Plan / UC Course  I have reviewed the triage vital signs and the nursing notes.  Pertinent labs & imaging results that were available during my care of the patient were reviewed by me and considered in my medical decision making (see chart for details).    Patient is COVID-positive per our testing here in clinic today.  She is generally well-appearing appears to have a very minor course of the virus.  Lung sounds are clear normal.  She has some mild congestion.  Discussed the benefits versus the risk of Paxlovid and provided a handout of all side effects associated with taking the antiviral therapy.  Patient has a few risk factors such as age and a history of asthma however fully vaccinated and boosted with all COVID vaccines.  Discussed that patient could contact this Clinical research associate tomorrow if she felt that her symptoms were worsening and I would agree to prescribing the Paxlovid.  Given the mildness of her symptoms there she has very mild disease or she has been COVID-positive for a while is at the end of the virus.  Urged patient to hydrate well with fluids.  Prescribed benzonatate  Perles for cough.  Patient's history of asthma refilled her albuterol  inhaler.  If any red flag symptoms develop ER.  Patient verbalized understanding and agreement with plan. Final Clinical Impressions(s) / UC Diagnoses   Final diagnoses:  COVID-19 virus infection     Discharge Instructions      Your COVID test is positive.  Recommend symptom management with antihistamine to help with congestion and postnasal drainage.  I have refilled your albuterol  inhaler which I recommend use if you develop any persistent coughing or chest tightness with breathing , and/or wheezing.  I have also prescribed you Tessalon  Perles which you can take up to 3 times daily as needed if any cough or he develops.  Well with  fluids.  Return to normal activities as tolerated.  As long as you are afebrile you are considered safe to be around others however around high risk individuals with low immune system or chronic disease recommend wearing a mask up to 7 days from your onset of test date.  If any red flag symptoms such as severe shortness of breath, chest tightness, durations in middle stiffness or weakness develops go to the nearest emergency department.    Call me tomorrow if any of your symptoms have remained unchanged or worsened and I will send over Paxlovid.     ED Prescriptions     Medication Sig Dispense Auth. Provider   benzonatate  (TESSALON ) 200 MG capsule Take 1  capsule (200 mg total) by mouth 3 (three) times daily as needed for cough. 40 capsule Buena Carmine, NP   albuterol  (VENTOLIN  HFA) 108 (90 Base) MCG/ACT inhaler Inhale 2 puffs into the lungs every 4 (four) hours as needed for wheezing or shortness of breath. 8 g Buena Carmine, NP      PDMP not reviewed this encounter.   Buena Carmine, NP 10/25/23 [phone]

## 2023-10-25 ENCOUNTER — Ambulatory Visit: Payer: Self-pay

## 2023-11-01 ENCOUNTER — Ambulatory Visit: Payer: Self-pay

## 2023-11-15 ENCOUNTER — Other Ambulatory Visit: Payer: Self-pay

## 2023-11-15 ENCOUNTER — Ambulatory Visit: Attending: Sports Medicine | Admitting: Physical Therapy

## 2023-11-15 ENCOUNTER — Encounter: Payer: Self-pay | Admitting: Physical Therapy

## 2023-11-15 DIAGNOSIS — M6281 Muscle weakness (generalized): Secondary | ICD-10-CM | POA: Diagnosis present

## 2023-11-15 DIAGNOSIS — M25512 Pain in left shoulder: Secondary | ICD-10-CM | POA: Insufficient documentation

## 2023-11-15 NOTE — Therapy (Signed)
 OUTPATIENT PHYSICAL THERAPY SHOULDER EVALUATION   Patient Name: Alison Cook MRN: [id number] DOB:01-04-1961, 63 y.o., female Today's Date: 11/15/2023   PT End of Session - 11/15/23 0920     Visit Number 1    Number of Visits --   1-2x/week   Date for PT Re-Evaluation 01/10/24    Authorization Type [employer] - Quick dash    PT Start Time 0815    PT Stop Time 0900    PT Time Calculation (min) 45 min             Past Medical History:  Diagnosis Date   Anxiety    Asthma    Back pain    Chest pain    Chronic headaches    Chronic pain of both feet    Depression    Difficulty sleeping    Essential hypertension    Heartburn    Hypertension    Knee pain    SOBOE (shortness of breath on exertion)    Stomach ulcer    Swelling of both lower extremities    Vitamin D  deficiency    Past Surgical History:  Procedure Laterality Date   OVARIAN CYST REMOVAL  1998   Patient Active Problem List   Diagnosis Date Noted   Allergic rhinitis 10/24/2023   Constipation 10/24/2023   Elevated LDL cholesterol level 10/24/2023   Gastroesophageal reflux disease 10/24/2023   Heartburn 10/24/2023   Situational stress 10/24/2023   Obesity with body mass index 30 or greater 10/24/2023   Dysfunction of both eustachian tubes 09/12/2023   Onychomycosis due to dermatophyte 07/15/2023   At risk for dehydration 10/26/2020   At risk for impaired metabolic function 10/05/2020   Insulin resistance 10/05/2020   Other hyperlipidemia 10/05/2020   Major depressive disorder 10/05/2020   Other fatigue 09/07/2020   SOBOE (shortness of breath on exertion) 09/07/2020   Essential (primary) hypertension 09/07/2020   Vitamin D  deficiency 09/07/2020   Mood disorder (HCC), with emtional eating 09/07/2020   At risk for depression 09/07/2020   Class 1 obesity with serious comorbidity and body mass index (BMI) of 33.0 to 33.9 in adult 09/07/2020   At high risk for breast cancer 08/14/2020   Polyp of corpus  uteri 06/06/2020    PCP: Rae Bugler, MD  REFERRING PROVIDER: Stafford Eagles, MD  THERAPY DIAG:  Left shoulder pain, unspecified chronicity  Muscle weakness  REFERRING DIAG: Pain in left shoulder [M25.512]   Rationale for Evaluation and Treatment:  Rehabilitation  SUBJECTIVE:  PERTINENT PAST HISTORY:  Depression      PRECAUTIONS: None  WEIGHT BEARING RESTRICTIONS No  FALLS:  Has patient fallen in last 6 months? No, Number of falls: 0  MOI/History of condition:  Onset date: 7-8 months  SUBJECTIVE STATEMENT  Alison Cook is a 63 y.o. female who presents to clinic with chief complaint of increasing stiffness in L shoulder which stated about 7-8. This started after she was working out in the pool and that night she started to have stiffness and pain.  She was diagnosed with frozen shoulder by her ortho and given a cortisone injection about a week ago.  The stiffness has stayed about the same over the last 7-8 months.  She has had an x-ray.   Red flags:  denies   Pain:  Are you having pain? 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: na  Hand Dominance: R  Patient Goals/Specific Activities: yardwork, yoga   OBJECTIVE:   DIAGNOSTIC FINDINGS:  None in epic  GENERAL OBSERVATION: Slight rounded shoulders     SENSATION: Light touch: Appears intact   PALPATION: Diffuse L shoulder pain  UPPER EXTREMITY AROM:  ROM Right (Eval) Left (Eval)  Shoulder flexion 170 110*  Shoulder abduction 170 85*  Shoulder internal rotation  Limited *  Shoulder external rotation  Limited *  Functional IR T7 Sacrum*  Functional ER T4 C3*  Shoulder extension    Elbow extension    Elbow flexion     (Blank rows = not tested, N = WNL, * = concordant pain with testing)  UPPER  EXTREMITY MMT:  MMT Right (Eval) Left (Eval)  Shoulder flexion  N in available range  Shoulder abduction (C5)    Shoulder ER  N  Shoulder IR  N  Middle trapezius    Lower trapezius    Shoulder extension    Grip strength    Cervical flexion (C1,C2)    Cervical S/B (C3)    Shoulder shrug (C4)    Elbow flexion (C6)    Elbow ext (C7)    Thumb ext (C8)    Finger abd (T1)    Grossly     (Blank rows = not tested, score listed is out of 5 possible points.  N = WNL, D = diminished, C = clear for gross weakness with myotome testing, * = concordant pain with testing)   UPPER EXTREMITY PROM:  PROM Right (Eval) Left (Eval)  Shoulder flexion  =AROM  Shoulder abduction  =AROM  Shoulder internal rotation  =AROM  Shoulder external rotation  =AROM  Functional IR    Functional ER    Shoulder extension    Elbow extension    Elbow flexion     (Blank rows = not tested, N = WNL, * = concordant pain with testing)   JOINT MOBILITY TESTING:  L GH joint hypomobile  PATIENT SURVEYS:  QuickDASH Score: 38.6 / 100 = 38.6 %    TODAY'S TREATMENT:  Therapeutic Exercise: Creating, reviewing, and completing portions of the below HEP  PA and inferior mobilization of L GH joint   PATIENT EDUCATION ([place]/[facility]):  POC, diagnosis, prognosis, HEP, and outcome measures.  Pt educated via explanation, demonstration, and handout (HEP).  Pt confirms understanding verbally.   HOME EXERCISE PROGRAM: Access Code: [email or web address] URL: [email or web address] Date: 11/15/2023 Prepared by: Lesleigh Rash  Exercises - Shoulder Flexion Wall Slide with Towel  - 1 x daily - 7 x weekly - 2 sets - 10 reps - Seated Shoulder Flexion Towel Slide at Table Top  - 1 x daily - 7 x weekly - 2 sets - 10 reps - Shoulder External Rotation and Scapular Retraction  - 1 x daily - 7 x weekly - 2 sets - 10 reps - Standing Shoulder External Rotation AAROM with Dowel  - 1 x daily - 7 x weekly - 2 sets - 10 reps -  Standing Cross Body Shoulder Stretch at Wall  - 2 x daily - 7 x weekly - 1 sets - 3 reps - 45 seconds hold - Standing Shoulder Posterior Capsule Stretch  - 2 x daily - 7 x weekly - 3 sets - 1 reps - 45 hold  Treatment priorities   Eval  ASSESSMENT:  CLINICAL IMPRESSION: Alison Cook is a 63 y.o. female who presents to clinic with signs and sxs consistent with physical impression of adhesive capsulitis.  Having combined stiffness and pain, leaning more toward stiffness at this point.  I reassured her she is doing all the right things and encouraged her to continue frequent gentle stretching.    OBJECTIVE IMPAIRMENTS: Pain, shoulder ROM, shoulder strength  ACTIVITY LIMITATIONS: reaching, lifting OH, yardwork, recreation  PERSONAL FACTORS: See medical history and pertinent history   REHAB POTENTIAL: Good  CLINICAL DECISION MAKING: Evolving/moderate complexity  EVALUATION COMPLEXITY: Moderate   GOALS:   SHORT TERM GOALS: Target date: 12/13/2023   Desteni will be >75% HEP compliant to improve carryover between sessions and facilitate independent management of condition  Evaluation: ongoing Goal status: INITIAL   LONG TERM GOALS: Target date: 01/10/2024   Aarthi will self report >/= 50% decrease in pain from evaluation to improve function in daily tasks  Evaluation/Baseline: 5/10 max pain Goal status: INITIAL   2.  Colin will show a >/= 20 pct improvement in her QUICK DASH score (MCID is 10% or ~5 pts) as a proxy for functional improvement   Evaluation/Baseline: QuickDASH Score: 38.6 / 100 = 38.6 % Goal status: INITIAL   3.  Zakira will be able to complete yard work and return to modified yoga, not limited by pain  Evaluation/Baseline: limited Goal status: INITIAL   4.  Tymeshia will report confidence in self management of condition at time of discharge with advanced HEP  Evaluation/Baseline: unable to self manage Goal  status: INITIAL   PLAN: PT FREQUENCY: 1-2x/week  PT DURATION: 8 weeks  PLANNED INTERVENTIONS:  [id number]- PT Re-evaluation, 97110-Therapeutic exercises, 97530- Therapeutic activity, 97112- Neuromuscular re-education, 97535- Self Care, [id number]- Manual therapy, [id number]- Gait training, [id number]- Aquatic Therapy, [phone]- Electrical stimulation (manual), [id number]- Vasopneumatic device, [id number]- Traction (mechanical), [id number]- Ionotophoresis 4mg /ml Dexamethasone, Taping, Dry Needling, Joint manipulation, and Spinal manipulation.   Nyiah Pianka PT, DPT 11/15/2023, 9:22 AM

## 2023-12-06 ENCOUNTER — Encounter: Payer: Self-pay | Admitting: Physical Therapy

## 2023-12-06 ENCOUNTER — Ambulatory Visit: Admitting: Physical Therapy

## 2023-12-06 DIAGNOSIS — M25512 Pain in left shoulder: Secondary | ICD-10-CM

## 2023-12-06 DIAGNOSIS — M6281 Muscle weakness (generalized): Secondary | ICD-10-CM

## 2023-12-06 NOTE — Therapy (Signed)
 OUTPATIENT PHYSICAL THERAPY DAILY NOTE   Patient Name: Alison Cook MRN: [id number] DOB:06-22-1960, 63 y.o., female Today's Date: 12/06/2023   PT End of Session - 12/06/23 1031     Visit Number 2    Number of Visits --   1-2x/week   Date for PT Re-Evaluation 01/10/24    Authorization Type [employer] - Quick dash    PT Start Time 1031    PT Stop Time 1111    PT Time Calculation (min) 40 min          Past Medical History:  Diagnosis Date   Anxiety    Asthma    Back pain    Chest pain    Chronic headaches    Chronic pain of both feet    Depression    Difficulty sleeping    Essential hypertension    Heartburn    Hypertension    Knee pain    SOBOE (shortness of breath on exertion)    Stomach ulcer    Swelling of both lower extremities    Vitamin D  deficiency    Past Surgical History:  Procedure Laterality Date   OVARIAN CYST REMOVAL  1998   Patient Active Problem List   Diagnosis Date Noted   Allergic rhinitis 10/24/2023   Constipation 10/24/2023   Elevated LDL cholesterol level 10/24/2023   Gastroesophageal reflux disease 10/24/2023   Heartburn 10/24/2023   Situational stress 10/24/2023   Obesity with body mass index 30 or greater 10/24/2023   Dysfunction of both eustachian tubes 09/12/2023   Onychomycosis due to dermatophyte 07/15/2023   At risk for dehydration 10/26/2020   At risk for impaired metabolic function 10/05/2020   Insulin resistance 10/05/2020   Other hyperlipidemia 10/05/2020   Major depressive disorder 10/05/2020   Other fatigue 09/07/2020   SOBOE (shortness of breath on exertion) 09/07/2020   Essential (primary) hypertension 09/07/2020   Vitamin D  deficiency 09/07/2020   Mood disorder (HCC), with emtional eating 09/07/2020   At risk for depression 09/07/2020   Class 1 obesity with serious comorbidity and body mass index (BMI) of 33.0 to 33.9 in adult 09/07/2020   At high risk for breast cancer 08/14/2020   Polyp of corpus uteri 06/06/2020     PCP: Seabron Lenis, MD  REFERRING PROVIDER: Arnaldo Juliene RAMAN, MD  THERAPY DIAG:  Left shoulder pain, unspecified chronicity  Muscle weakness  REFERRING DIAG: Pain in left shoulder [M25.512]   Rationale for Evaluation and Treatment:  Rehabilitation  SUBJECTIVE:  PERTINENT PAST HISTORY:  Depression      PRECAUTIONS: None  WEIGHT BEARING RESTRICTIONS No  FALLS:  Has patient fallen in last 6 months? No, Number of falls: 0  MOI/History of condition:  Onset date: 7-8 months  SUBJECTIVE STATEMENT  12/06/2023:  Pt reports that she has been doing her HEP and sees some improvement.   EVAL: Alison Cook is a 63 y.o. female who presents to clinic with chief complaint of increasing stiffness in L shoulder which stated about 7-8. This started after she was working out in the pool and that night she started to have stiffness and pain.  She was diagnosed with frozen shoulder by her ortho and given a cortisone injection about a week ago.  The stiffness has stayed about the same over the last 7-8 months.  She has had an x-ray.   Red flags:  denies   Pain:  Are you having pain? 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  Hand Dominance: R  Patient Goals/Specific Activities: yardwork, yoga   OBJECTIVE:   DIAGNOSTIC FINDINGS:  None in epic  GENERAL OBSERVATION: Slight rounded shoulders     SENSATION: Light touch: Appears intact   PALPATION: Diffuse L shoulder pain  UPPER EXTREMITY AROM:  ROM Right (Eval) Left (Eval) L 6/28  Shoulder flexion 170 110* 115*  Shoulder abduction 170 85* 95*  Shoulder internal rotation  Limited *   Shoulder external rotation  Limited *   Functional IR T7 Sacrum*   Functional ER T4 C3*   Shoulder extension     Elbow extension      Elbow flexion      (Blank rows = not tested, N = WNL, * = concordant pain with testing)  UPPER EXTREMITY MMT:  MMT Right (Eval) Left (Eval)  Shoulder flexion  N in available range  Shoulder abduction (C5)    Shoulder ER  N  Shoulder IR  N  Middle trapezius    Lower trapezius    Shoulder extension    Grip strength    Cervical flexion (C1,C2)    Cervical S/B (C3)    Shoulder shrug (C4)    Elbow flexion (C6)    Elbow ext (C7)    Thumb ext (C8)    Finger abd (T1)    Grossly     (Blank rows = not tested, score listed is out of 5 possible points.  N = WNL, D = diminished, C = clear for gross weakness with myotome testing, * = concordant pain with testing)   UPPER EXTREMITY PROM:  PROM Right (Eval) Left (Eval)  Shoulder flexion  =AROM  Shoulder abduction  =AROM  Shoulder internal rotation  =AROM  Shoulder external rotation  =AROM  Functional IR    Functional ER    Shoulder extension    Elbow extension    Elbow flexion     (Blank rows = not tested, N = WNL, * = concordant pain with testing)   JOINT MOBILITY TESTING:  L GH joint hypomobile  PATIENT SURVEYS:  QuickDASH Score: 38.6 / 100 = 38.6 %    TODAY'S TREATMENT:   [facility] Adult PT Treatment  12/06/2023:  Therapeutic Exercise: S/L ER - 3x10 S/L shoulder abd - short lever - 2x10 Shoulder supported ER @45  Updating HEP Discussing expectations for discomfort ad modification of HEP and stretching.  Manual Therapy  D2 flexion w/ light resistance Rhythmic stabilization  AP and inferior joint mobs    HOME EXERCISE PROGRAM: Access Code: [email or web address] URL: [email or web address] Date: 12/06/2023 Prepared by: Helene Gasmen  Exercises - Shoulder Flexion Wall Slide with Towel  - 1 x daily - 7 x weekly - 2 sets - 10 reps - Shoulder External Rotation and Scapular Retraction  - 1 x daily - 7 x weekly - 2 sets - 10 reps - Standing Shoulder Posterior Capsule Stretch  - 2 x daily - 7 x weekly - 3 sets  - 1 reps - 45 hold - Seated Shoulder External Rotation AROM in Supported Abduction  - 1 x daily - 7 x weekly - 3 sets - 7 reps  Treatment priorities   Eval  ASSESSMENT:  CLINICAL IMPRESSION:  12/06/2023:  Alison Cook tolerated session well with no adverse reaction.  ER and flexion ROM improved significantly today.  IR remains limited but improving.  HEP updated.  Pt is currently participating in light yoga and regular HEP for stretching and is seeing consistent progress.  Encouraged her to continue with this.  Discussed expectations for pain and modifying intensity based on sxs.  She can continue HEP and follow up PRN.  EVAL: Alison Cook is a 63 y.o. female who presents to clinic with signs and sxs consistent with physical impression of adhesive capsulitis.  Having combined stiffness and pain, leaning more toward stiffness at this point.  I reassured her she is doing all the right things and encouraged her to continue frequent gentle stretching.    OBJECTIVE IMPAIRMENTS: Pain, shoulder ROM, shoulder strength  ACTIVITY LIMITATIONS: reaching, lifting OH, yardwork, recreation  PERSONAL FACTORS: See medical history and pertinent history   REHAB POTENTIAL: Good  CLINICAL DECISION MAKING: Evolving/moderate complexity  EVALUATION COMPLEXITY: Moderate   GOALS:   SHORT TERM GOALS: Target date: 01/03/2024   Alison Cook will be >75% HEP compliant to improve carryover between sessions and facilitate independent management of condition  Evaluation: ongoing Goal status: INITIAL   LONG TERM GOALS: Target date: 01/31/2024   Alison Cook will self report >/= 50% decrease in pain from evaluation to improve function in daily tasks  Evaluation/Baseline: 5/10 max pain Goal status: INITIAL   2.  Alison Cook will show a >/= 20 pct improvement in her QUICK DASH score (MCID is 10% or ~5 pts) as a proxy for functional improvement   Evaluation/Baseline: QuickDASH  Score: 38.6 / 100 = 38.6 % Goal status: INITIAL   3.  Alison Cook will be able to complete yard work and return to modified yoga, not limited by pain  Evaluation/Baseline: limited Goal status: INITIAL   4.  Alison Cook will report confidence in self management of condition at time of discharge with advanced HEP  Evaluation/Baseline: unable to self manage Goal status: INITIAL   PLAN: PT FREQUENCY: 1-2x/week  PT DURATION: 8 weeks  PLANNED INTERVENTIONS:  [id number]- PT Re-evaluation, 97110-Therapeutic exercises, 97530- Therapeutic activity, 97112- Neuromuscular re-education, 97535- Self Care, [id number]- Manual therapy, [id number]- Gait training, [id number]- Aquatic Therapy, [phone]- Electrical stimulation (manual), [id number]- Vasopneumatic device, [id number]- Traction (mechanical), [id number]- Ionotophoresis 4mg /ml Dexamethasone, Taping, Dry Needling, Joint manipulation, and Spinal manipulation.   Nimra Puccinelli PT, DPT 12/06/2023, 11:15 AM

## 2024-01-03 ENCOUNTER — Ambulatory Visit: Admitting: Physical Therapy

## 2024-04-20 ENCOUNTER — Other Ambulatory Visit: Payer: Self-pay | Admitting: Family Medicine

## 2024-04-20 DIAGNOSIS — Z006 Encounter for examination for normal comparison and control in clinical research program: Secondary | ICD-10-CM

## 2024-05-04 ENCOUNTER — Other Ambulatory Visit

## 2024-05-05 ENCOUNTER — Ambulatory Visit
Admission: RE | Admit: 2024-05-05 | Discharge: 2024-05-05 | Disposition: A | Discharge status: Home / Self Care | Source: Ambulatory Visit | Attending: Family Medicine | Admitting: Family Medicine

## 2024-05-05 DIAGNOSIS — Z006 Encounter for examination for normal comparison and control in clinical research program: Secondary | ICD-10-CM

## 2024-06-07 ENCOUNTER — Other Ambulatory Visit: Payer: Self-pay | Admitting: Obstetrics and Gynecology

## 2024-06-07 DIAGNOSIS — Z9189 Other specified personal risk factors, not elsewhere classified: Secondary | ICD-10-CM

## 2024-07-07 ENCOUNTER — Ambulatory Visit
Admission: RE | Admit: 2024-07-07 | Discharge: 2024-07-07 | Disposition: A | Source: Ambulatory Visit | Attending: Obstetrics and Gynecology | Admitting: Obstetrics and Gynecology

## 2024-07-07 DIAGNOSIS — Z9189 Other specified personal risk factors, not elsewhere classified: Secondary | ICD-10-CM

## 2024-07-07 MED ORDER — GADOPICLENOL 0.5 MMOL/ML IV SOLN
8.0000 mL | Freq: Once | INTRAVENOUS | Status: AC | PRN
  Administered 2024-07-07: 8 mL via INTRAVENOUS
